# Patient Record
Sex: Male | Born: 1980 | Race: White | Hispanic: No | Marital: Single | Smoking: Current every day smoker
Health system: Southern US, Community
[De-identification: ages and names within clinical notes are randomized; demographics above are authoritative.]

## PROBLEM LIST (undated history)

## (undated) DIAGNOSIS — F419 Anxiety disorder, unspecified: Secondary | ICD-10-CM

## (undated) DIAGNOSIS — Z818 Family history of other mental and behavioral disorders: Secondary | ICD-10-CM

## (undated) DIAGNOSIS — F329 Major depressive disorder, single episode, unspecified: Secondary | ICD-10-CM

## (undated) DIAGNOSIS — J45909 Unspecified asthma, uncomplicated: Secondary | ICD-10-CM

## (undated) DIAGNOSIS — F4310 Post-traumatic stress disorder, unspecified: Secondary | ICD-10-CM

## (undated) DIAGNOSIS — F32A Depression, unspecified: Secondary | ICD-10-CM

## (undated) HISTORY — PX: TOE SURGERY: SHX1073

---
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
## 1998-10-22 ENCOUNTER — Emergency Department (HOSPITAL_COMMUNITY): Admission: EM | Admit: 1998-10-22 | Discharge: 1998-10-22 | Payer: Self-pay

## 2007-01-12 ENCOUNTER — Emergency Department (HOSPITAL_COMMUNITY): Admission: EM | Admit: 2007-01-12 | Discharge: 2007-01-13 | Payer: Self-pay | Admitting: Emergency Medicine

## 2007-07-19 ENCOUNTER — Emergency Department (HOSPITAL_COMMUNITY): Admission: EM | Admit: 2007-07-19 | Discharge: 2007-07-19 | Payer: Self-pay | Admitting: Emergency Medicine

## 2010-12-20 LAB — CBC
HCT: 42.6
Hemoglobin: 14.5
MCHC: 34
MCV: 85.4
Platelets: 265
RBC: 4.99
RDW: 12.1
WBC: 10.4

## 2010-12-20 LAB — DIFFERENTIAL
Basophils Absolute: 0
Basophils Relative: 0
Eosinophils Absolute: 0.5
Eosinophils Relative: 5
Lymphocytes Relative: 18
Lymphs Abs: 1.8
Monocytes Absolute: 0.6
Monocytes Relative: 6
Neutro Abs: 7.5
Neutrophils Relative %: 71

## 2010-12-20 LAB — BASIC METABOLIC PANEL
BUN: 9
CO2: 27
Calcium: 9.2
Chloride: 101
Creatinine, Ser: 0.93
GFR calc Af Amer: >60
GFR calc non Af Amer: >60
Glucose, Bld: 107 — ABNORMAL HIGH
Potassium: 3.8
Sodium: 135

## 2012-07-04 ENCOUNTER — Emergency Department (HOSPITAL_COMMUNITY)
Admission: EM | Admit: 2012-07-04 | Discharge: 2012-07-04 | Disposition: A | Discharge status: Home / Self Care | Payer: Self-pay | Attending: Emergency Medicine | Admitting: Emergency Medicine

## 2012-07-04 ENCOUNTER — Encounter (HOSPITAL_COMMUNITY): Payer: Self-pay | Admitting: Emergency Medicine

## 2012-07-04 DIAGNOSIS — Z87891 Personal history of nicotine dependence: Secondary | ICD-10-CM | POA: Insufficient documentation

## 2012-07-04 DIAGNOSIS — IMO0001 Reserved for inherently not codable concepts without codable children: Secondary | ICD-10-CM | POA: Insufficient documentation

## 2012-07-04 DIAGNOSIS — R5381 Other malaise: Secondary | ICD-10-CM | POA: Insufficient documentation

## 2012-07-04 DIAGNOSIS — J020 Streptococcal pharyngitis: Secondary | ICD-10-CM | POA: Insufficient documentation

## 2012-07-04 DIAGNOSIS — R509 Fever, unspecified: Secondary | ICD-10-CM | POA: Insufficient documentation

## 2012-07-04 DIAGNOSIS — R110 Nausea: Secondary | ICD-10-CM | POA: Insufficient documentation

## 2012-07-04 DIAGNOSIS — R51 Headache: Secondary | ICD-10-CM | POA: Insufficient documentation

## 2012-07-04 DIAGNOSIS — K089 Disorder of teeth and supporting structures, unspecified: Secondary | ICD-10-CM | POA: Insufficient documentation

## 2012-07-04 DIAGNOSIS — R42 Dizziness and giddiness: Secondary | ICD-10-CM | POA: Insufficient documentation

## 2012-07-04 LAB — RAPID STREP SCREEN (MED CTR MEBANE ONLY): Streptococcus, Group A Screen (Direct): POSITIVE — AB

## 2012-07-04 MED ORDER — PENICILLIN G BENZATHINE 1200000 UNIT/2ML IM SUSP
1.2000 10*6.[IU] | Freq: Once | INTRAMUSCULAR | Status: AC
  Administered 2012-07-04: 1.2 10*6.[IU] via INTRAMUSCULAR
  Filled 2012-07-04: qty 2

## 2012-07-04 MED ORDER — DEXAMETHASONE SODIUM PHOSPHATE 10 MG/ML IJ SOLN
10.0000 mg | Freq: Once | INTRAMUSCULAR | Status: AC
  Administered 2012-07-04: 10 mg via INTRAMUSCULAR
  Filled 2012-07-04: qty 1

## 2012-07-04 NOTE — ED Provider Notes (Signed)
Medical screening examination/treatment/procedure(s) were performed by non-physician practitioner and as supervising physician I was immediately available for consultation/collaboration.   Dione Booze, MD 07/04/12 [phone]

## 2012-07-04 NOTE — ED Provider Notes (Signed)
History     CSN: [id number]  Arrival date & time 07/04/12  2118   First MD Initiated Contact with Patient 07/04/12 2157      Chief Complaint  Patient presents with  . Sore Throat    (Consider location/radiation/quality/duration/timing/severity/associated sxs/prior treatment) HPI Comments: Patient presents with malaise, sore throat, fever and chills, dizziness, headache for the past 2 days. Patient taking ibuprofen without relief. He is able to swallow. No cough or shortness of breath. No difficulty walking. No neck pain or trouble moving. Onset of symptoms acute. Course is constant. Nothing makes symptoms better. Swallowing makes pain worse.  The history is provided by the patient.    History reviewed. No pertinent past medical history.  Past Surgical History  Procedure Laterality Date  . Toe surgery      No family history on file.  History  Substance Use Topics  . Smoking status: Former Games developer  . Smokeless tobacco: Not on file  . Alcohol Use: No      Review of Systems  Constitutional: Positive for fever, chills and fatigue.  HENT: Positive for sore throat and dental problem. Negative for ear pain, congestion, rhinorrhea, neck pain and neck stiffness.   Eyes: Negative for photophobia and redness.  Respiratory: Negative for cough.   Cardiovascular: Negative for chest pain.  Gastrointestinal: Positive for nausea. Negative for vomiting, abdominal pain and diarrhea.  Genitourinary: Negative for dysuria.  Musculoskeletal: Positive for myalgias.  Skin: Negative for rash.  Neurological: Positive for headaches.    Allergies  Review of patient's allergies indicates no known allergies.  Home Medications  No current outpatient prescriptions on file.  BP 128/78  Pulse 98  Temp(Src) 99.8 F (37.7 C) (Oral)  Resp 14  SpO2 97%  Physical Exam  Nursing note and vitals reviewed. Constitutional: He appears well-developed and well-nourished.  HENT:  Head: Normocephalic  and atraumatic. No trismus in the jaw.  Right Ear: Tympanic membrane, external ear and ear canal normal.  Left Ear: Tympanic membrane, external ear and ear canal normal.  Nose: Nose normal. No mucosal edema or rhinorrhea.  Mouth/Throat: Uvula is midline and mucous membranes are normal. Mucous membranes are not dry. No edematous. Posterior oropharyngeal edema and posterior oropharyngeal erythema present. No oropharyngeal exudate or tonsillar abscesses.  Eyes: Conjunctivae are normal. Right eye exhibits no discharge. Left eye exhibits no discharge.  Neck: Normal range of motion. Neck supple.  No meningismus  Cardiovascular: Normal rate, regular rhythm and normal heart sounds.   Pulmonary/Chest: Effort normal and breath sounds normal. No respiratory distress. He has no wheezes. He has no rales.  Abdominal: Soft. There is no tenderness.  Lymphadenopathy:    He has cervical adenopathy.  Neurological: He is alert.  Skin: Skin is warm and dry.  Psychiatric: He has a normal mood and affect.    ED Course  Procedures (including critical care time)  Labs Reviewed  RAPID STREP SCREEN - Abnormal; Notable for the following:    Streptococcus, Group A Screen (Direct) POSITIVE (*)    All other components within normal limits   No results found.   1. Streptococcal pharyngitis     10:48 PM Patient seen and examined. Medications ordered.   Vital signs reviewed and are as follows: Filed Vitals:   07/04/12 2129  BP: 128/78  Pulse: 98  Temp: 99.8 F (37.7 C)  Resp: 14   Patient counseled on supportive care for strep and s/s to return including worsening symptoms, persistent fever, persistent vomiting, voice change, trouble  swallowing or if they have any other concerns.  Urged to see PCP if symptoms persist for more than 3 days. Patient verbalizes understanding and agrees with plan.      MDM  Streptococcal pharyngitis. No sign of peritonsillar abscess. Do not suspect meningitis with  headache. Headache is likely due to his infection. Neuro exam is normal. Patient appears well, nontoxic. Treated with antibiotics in ED. Steroids given for symptomatic relief.      Renne Crigler, PA-C 07/04/12 2307

## 2012-07-04 NOTE — ED Notes (Signed)
PT. REPORTS SORE THROAT WITH CHILLS ,LOW GRADE FEVER , DIZZINESS AND HEADACHE FOR 2 DAYS .

## 2012-11-27 ENCOUNTER — Emergency Department (HOSPITAL_COMMUNITY)
Admission: EM | Admit: 2012-11-27 | Discharge: 2012-11-27 | Disposition: A | Discharge status: Home / Self Care | Payer: Self-pay | Attending: Emergency Medicine | Admitting: Emergency Medicine

## 2012-11-27 ENCOUNTER — Emergency Department (HOSPITAL_COMMUNITY): Payer: Self-pay

## 2012-11-27 ENCOUNTER — Encounter (HOSPITAL_COMMUNITY): Payer: Self-pay | Admitting: *Deleted

## 2012-11-27 DIAGNOSIS — Z87891 Personal history of nicotine dependence: Secondary | ICD-10-CM | POA: Insufficient documentation

## 2012-11-27 DIAGNOSIS — M79641 Pain in right hand: Secondary | ICD-10-CM

## 2012-11-27 DIAGNOSIS — S6990XA Unspecified injury of unspecified wrist, hand and finger(s), initial encounter: Secondary | ICD-10-CM | POA: Insufficient documentation

## 2012-11-27 MED ORDER — ONDANSETRON 4 MG PO TBDP
8.0000 mg | ORAL_TABLET | ORAL | Status: AC
  Administered 2012-11-27: 8 mg via ORAL
  Filled 2012-11-27: qty 2

## 2012-11-27 MED ORDER — HYDROCODONE-ACETAMINOPHEN 5-325 MG PO TABS
2.0000 | ORAL_TABLET | ORAL | Status: AC
  Administered 2012-11-27: 2 via ORAL
  Filled 2012-11-27: qty 2
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
## 2012-11-27 NOTE — ED Notes (Signed)
Patient transported to X-ray 

## 2012-11-29 NOTE — ED Provider Notes (Signed)
Medical screening examination/treatment/procedure(s) were performed by non-physician practitioner and as supervising physician I was immediately available for consultation/collaboration.   Yoshua Geisinger W. Kashif Pooler, MD 11/29/12 0820 

## 2012-12-04 ENCOUNTER — Encounter (HOSPITAL_COMMUNITY): Payer: Self-pay | Admitting: *Deleted

## 2012-12-04 ENCOUNTER — Emergency Department (HOSPITAL_COMMUNITY)
Admission: EM | Admit: 2012-12-04 | Discharge: 2012-12-04 | Disposition: A | Discharge status: Home / Self Care | Payer: Self-pay | Attending: Emergency Medicine | Admitting: Emergency Medicine

## 2012-12-04 DIAGNOSIS — Z79899 Other long term (current) drug therapy: Secondary | ICD-10-CM | POA: Insufficient documentation

## 2012-12-04 DIAGNOSIS — J029 Acute pharyngitis, unspecified: Secondary | ICD-10-CM | POA: Insufficient documentation

## 2012-12-04 DIAGNOSIS — Z87891 Personal history of nicotine dependence: Secondary | ICD-10-CM | POA: Insufficient documentation

## 2012-12-04 DIAGNOSIS — Z8659 Personal history of other mental and behavioral disorders: Secondary | ICD-10-CM | POA: Insufficient documentation

## 2012-12-04 HISTORY — DX: Family history of other mental and behavioral disorders: Z81.8

## 2012-12-04 MED ORDER — PSEUDOEPHEDRINE HCL 60 MG PO TABS: 60.0000 mg | ORAL_TABLET | ORAL | Status: DC | PRN

## 2012-12-04 NOTE — ED Notes (Signed)
Pt states already had strep throat this year, feels the same with throat hurting.

## 2012-12-04 NOTE — ED Provider Notes (Signed)
CSN: [id number]     Arrival date & time 12/04/12  2004 History  This chart was scribed for non-physician practitioner working with Shelda Jakes, MD by Greggory Stallion, ED scribe. This patient was seen in room TR11C/TR11C and the patient's care was started at 10:45 PM.   Chief Complaint  Patient presents with  . Sore Throat   The history is provided by the patient. No language interpreter was used.    HPI Comments: Stephen Ramsey is a 32 y.o. male who presents to the Emergency Department complaining of gradual onset, gradually worsening burning sore throat that started around noon today. He has also had a headache and burning nasal congestion. Pt states productive cough worsens the pain. Pt has used Hall's cough drops with no relief. He denies ear pain, fever, abdominal pain, nausea and emesis. Pt states he smokes cigarettes daily.   Past Medical History  Diagnosis Date  . FH: mental illness    Past Surgical History  Procedure Laterality Date  . Toe surgery     History reviewed. No pertinent family history. History  Substance Use Topics  . Smoking status: Former Games developer  . Smokeless tobacco: Not on file  . Alcohol Use: No    Review of Systems  Constitutional: Negative for fever.  HENT: Positive for congestion and sore throat. Negative for ear pain.   Respiratory: Positive for cough.   Gastrointestinal: Negative for nausea, vomiting and abdominal pain.  Neurological: Positive for headaches.  All other systems reviewed and are negative.    Allergies  Review of patient's allergies indicates no known allergies.  Home Medications   Current Outpatient Rx  Name  Route  Sig  Dispense  Refill  . ARIPiprazole (ABILIFY) 10 MG tablet   Oral   Take 10 mg by mouth daily.         Marland Kitchen FLUoxetine (PROZAC) 40 MG capsule   Oral   Take 40 mg by mouth daily.          BP 123/66  Pulse 93  Temp(Src) 98.5 F (36.9 C) (Oral)  Resp 16  Ht 5\' 7"  (1.702 m)  Wt 210 lb (95.255  kg)  BMI 32.88 kg/m2  SpO2 100%  Physical Exam  Nursing note and vitals reviewed. Constitutional: He is oriented to person, place, and time. He appears well-developed and well-nourished. No distress.  HENT:  Head: Normocephalic and atraumatic.  Right Ear: Tympanic membrane and external ear normal.  Left Ear: Tympanic membrane and external ear normal.  Nose: Nose normal.  No tonsillar swelling. No exudate. Uvula midline. No trismus.   Eyes: Conjunctivae are normal.  Neck: Normal range of motion. No tracheal deviation present.  Cardiovascular: Normal rate, regular rhythm and normal heart sounds.   Pulmonary/Chest: Effort normal and breath sounds normal. No stridor.  Abdominal: Soft. He exhibits no distension. There is no tenderness.  Musculoskeletal: Normal range of motion.  Lymphadenopathy:    He has cervical adenopathy.  Neurological: He is alert and oriented to person, place, and time.  Skin: Skin is warm and dry. He is not diaphoretic.  Psychiatric: He has a normal mood and affect. His behavior is normal.    ED Course  Procedures (including critical care time)  DIAGNOSTIC STUDIES: Oxygen Saturation is 100% on RA, normal by my interpretation.    COORDINATION OF CARE: 10:49 PM-Discussed treatment plan which includes nasal saline spray, salt water gargles, chloraseptic spray and decongestant with pt at bedside and pt agreed to plan.  Labs Review Labs Reviewed  RAPID STREP SCREEN  CULTURE, GROUP A STREP   Smoking cessation instruction/counseling given:  counseled patient on the dangers of tobacco use, advised patient to stop smoking, and reviewed strategies to maximize success. Patient is very reluctant to quit.    Imaging Review No results found.  MDM   1. Viral pharyngitis    Pt afebrile without tonsillar exudate, negative strep. Presents with mild cervical lymphadenopathy, & dysphagia; diagnosis of viral pharyngitis. No abx indicated. DC w symptomatic tx. Pt does  not appear dehydrated, but did discuss importance of water rehydration. Presentation non concerning for PTA or infxn spread to soft tissue. No trismus or uvula deviation. Specific return precautions discussed. Pt able to drink water in ED without difficulty with intact air way. Recommended PCP follow up.    I personally performed the services described in this documentation, which was scribed in my presence. The recorded information has been reviewed and is accurate.    Mora Bellman, PA-C 12/05/12 [phone]

## 2012-12-05 NOTE — ED Provider Notes (Signed)
Medical screening examination/treatment/procedure(s) were performed by non-physician practitioner and as supervising physician I was immediately available for consultation/collaboration.   Demetrios Byron W. Jaskirat Zertuche, MD 12/05/12 1213 

## 2012-12-06 LAB — CULTURE, GROUP A STREP

## 2014-01-17 ENCOUNTER — Emergency Department (HOSPITAL_COMMUNITY): Payer: Self-pay

## 2014-01-17 ENCOUNTER — Emergency Department (HOSPITAL_COMMUNITY)
Admission: EM | Admit: 2014-01-17 | Discharge: 2014-01-17 | Disposition: A | Discharge status: Home / Self Care | Payer: Self-pay | Attending: Emergency Medicine | Admitting: Emergency Medicine

## 2014-01-17 ENCOUNTER — Encounter (HOSPITAL_COMMUNITY): Payer: Self-pay | Admitting: Emergency Medicine

## 2014-01-17 DIAGNOSIS — R079 Chest pain, unspecified: Secondary | ICD-10-CM | POA: Insufficient documentation

## 2014-01-17 DIAGNOSIS — Z720 Tobacco use: Secondary | ICD-10-CM | POA: Insufficient documentation

## 2014-01-17 DIAGNOSIS — R05 Cough: Secondary | ICD-10-CM

## 2014-01-17 DIAGNOSIS — Z79899 Other long term (current) drug therapy: Secondary | ICD-10-CM | POA: Insufficient documentation

## 2014-01-17 DIAGNOSIS — R059 Cough, unspecified: Secondary | ICD-10-CM

## 2014-01-17 DIAGNOSIS — J069 Acute upper respiratory infection, unspecified: Secondary | ICD-10-CM

## 2014-01-17 DIAGNOSIS — Z8659 Personal history of other mental and behavioral disorders: Secondary | ICD-10-CM | POA: Insufficient documentation

## 2014-01-17 DIAGNOSIS — R062 Wheezing: Secondary | ICD-10-CM

## 2014-01-17 MED ORDER — ALBUTEROL SULFATE HFA 108 (90 BASE) MCG/ACT IN AERS
2.0000 | INHALATION_SPRAY | Freq: Once | RESPIRATORY_TRACT | Status: AC
  Administered 2014-01-17: 2 via RESPIRATORY_TRACT
  Filled 2014-01-17: qty 6.7

## 2014-01-17 MED ORDER — ALBUTEROL SULFATE (2.5 MG/3ML) 0.083% IN NEBU
5.0000 mg | INHALATION_SOLUTION | Freq: Once | RESPIRATORY_TRACT | Status: AC
  Administered 2014-01-17: 5 mg via RESPIRATORY_TRACT
  Filled 2014-01-17: qty 6

## 2014-01-17 MED ORDER — PREDNISONE 20 MG PO TABS: 60.0000 mg | ORAL_TABLET | Freq: Every day | ORAL | Status: DC

## 2014-01-17 MED ORDER — IPRATROPIUM BROMIDE 0.02 % IN SOLN
0.5000 mg | Freq: Once | RESPIRATORY_TRACT | Status: AC
  Administered 2014-01-17: 0.5 mg via RESPIRATORY_TRACT
  Filled 2014-01-17: qty 2.5

## 2014-01-17 MED ORDER — AZITHROMYCIN 250 MG PO TABS: 250.0000 mg | ORAL_TABLET | Freq: Every day | ORAL | Status: DC

## 2014-01-17 MED ORDER — PREDNISONE 20 MG PO TABS
60.0000 mg | ORAL_TABLET | Freq: Once | ORAL | Status: AC
  Administered 2014-01-17: 60 mg via ORAL
  Filled 2014-01-17: qty 3

## 2014-01-17 NOTE — ED Notes (Signed)
PT reports feeling better; resting; no signs of distress or chest pain reported.

## 2014-01-17 NOTE — ED Notes (Signed)
WHEEZING FOR few days; pack day smoker. Takes OTC allergy meds. No hx of asthma. Coughing; denies fevers. Chest pain when coughing.

## 2014-01-17 NOTE — Discharge Instructions (Signed)
How to Use an Inhaler Proper inhaler technique is very important. Good technique ensures that the medicine reaches the lungs. Poor technique results in depositing the medicine on the tongue and back of the throat rather than in the airways. If you do not use the inhaler with good technique, the medicine will not help you. STEPS TO FOLLOW IF USING AN INHALER WITHOUT AN EXTENSION TUBE  Remove the cap from the inhaler.  If you are using the inhaler for the first time, you will need to prime it. Shake the inhaler for 5 seconds and release four puffs into the air, away from your face. Ask your health care provider or pharmacist if you have questions about priming your inhaler.  Shake the inhaler for 5 seconds before each breath in (inhalation).  Position the inhaler so that the top of the canister faces up.  Put your index finger on the top of the medicine canister. Your thumb supports the bottom of the inhaler.  Open your mouth.  Either place the inhaler between your teeth and place your lips tightly around the mouthpiece, or hold the inhaler 1-2 inches away from your open mouth. If you are unsure of which technique to use, ask your health care provider.  Breathe out (exhale) normally and as completely as possible.  Press the canister down with your index finger to release the medicine.  At the same time as the canister is pressed, inhale deeply and slowly until your lungs are completely filled. This should take 4-6 seconds. Keep your tongue down.  Hold the medicine in your lungs for 5-10 seconds (10 seconds is best). This helps the medicine get into the small airways of your lungs.  Breathe out slowly, through pursed lips. Whistling is an example of pursed lips.  Wait at least 15-30 seconds between puffs. Continue with the above steps until you have taken the number of puffs your health care provider has ordered. Do not use the inhaler more than your health care provider tells  you.  Replace the cap on the inhaler.  Follow the directions from your health care provider or the inhaler insert for cleaning the inhaler. STEPS TO FOLLOW IF USING AN INHALER WITH AN EXTENSION (SPACER)  Remove the cap from the inhaler.  If you are using the inhaler for the first time, you will need to prime it. Shake the inhaler for 5 seconds and release four puffs into the air, away from your face. Ask your health care provider or pharmacist if you have questions about priming your inhaler.  Shake the inhaler for 5 seconds before each breath in (inhalation).  Place the open end of the spacer onto the mouthpiece of the inhaler.  Position the inhaler so that the top of the canister faces up and the spacer mouthpiece faces you.  Put your index finger on the top of the medicine canister. Your thumb supports the bottom of the inhaler and the spacer.  Breathe out (exhale) normally and as completely as possible.  Immediately after exhaling, place the spacer between your teeth and into your mouth. Close your lips tightly around the spacer.  Press the canister down with your index finger to release the medicine.  At the same time as the canister is pressed, inhale deeply and slowly until your lungs are completely filled. This should take 4-6 seconds. Keep your tongue down and out of the way.  Hold the medicine in your lungs for 5-10 seconds (10 seconds is best). This helps the  medicine get into the small airways of your lungs. Exhale.  Repeat inhaling deeply through the spacer mouthpiece. Again hold that breath for up to 10 seconds (10 seconds is best). Exhale slowly. If it is difficult to take this second deep breath through the spacer, breathe normally several times through the spacer. Remove the spacer from your mouth.  Wait at least 15-30 seconds between puffs. Continue with the above steps until you have taken the number of puffs your health care provider has ordered. Do not use the  inhaler more than your health care provider tells you.  Remove the spacer from the inhaler, and place the cap on the inhaler.  Follow the directions from your health care provider or the inhaler insert for cleaning the inhaler and spacer. If you are using different kinds of inhalers, use your quick relief medicine to open the airways 10-15 minutes before using a steroid if instructed to do so by your health care provider. If you are unsure which inhalers to use and the order of using them, ask your health care provider, nurse, or respiratory therapist. If you are using a steroid inhaler, always rinse your mouth with water after your last puff, then gargle and spit out the water. Do not swallow the water. AVOID:  Inhaling before or after starting the spray of medicine. It takes practice to coordinate your breathing with triggering the spray.  Inhaling through the nose (rather than the mouth) when triggering the spray. HOW TO DETERMINE IF YOUR INHALER IS FULL OR NEARLY EMPTY You cannot know when an inhaler is empty by shaking it. A few inhalers are now being made with dose counters. Ask your health care provider for a prescription that has a dose counter if you feel you need that extra help. If your inhaler does not have a counter, ask your health care provider to help you determine the date you need to refill your inhaler. Write the refill date on a calendar or your inhaler canister. Refill your inhaler 7-10 days before it runs out. Be sure to keep an adequate supply of medicine. This includes making sure it is not expired, and that you have a spare inhaler.  SEEK MEDICAL CARE IF:   Your symptoms are only partially relieved with your inhaler.  You are having trouble using your inhaler.  You have some increase in phlegm. SEEK IMMEDIATE MEDICAL CARE IF:   You feel little or no relief with your inhalers. You are still wheezing and are feeling shortness of breath or tightness in your chest or  both.  You have dizziness, headaches, or a fast heart rate.  You have chills, fever, or night sweats.  You have a noticeable increase in phlegm production, or there is blood in the phlegm. MAKE SURE YOU:   Understand these instructions.  Will watch your condition.  Will get help right away if you are not doing well or get worse. Document Released: 02/25/2000 Document Revised: 12/18/2012 Document Reviewed: 09/26/2012 [facility] Patient Information 2015 [place], [place]. This information is not intended to replace advice given to you by your health care provider. Make sure you discuss any questions you have with your health care provider.  Upper Respiratory Infection, Adult An upper respiratory infection (URI) is also sometimes known as the common cold. The upper respiratory tract includes the nose, sinuses, throat, trachea, and bronchi. Bronchi are the airways leading to the lungs. Most people improve within 1 week, but symptoms can last up to 2 weeks. A residual  cough may last even longer.  CAUSES Many different viruses can infect the tissues lining the upper respiratory tract. The tissues become irritated and inflamed and often become very moist. Mucus production is also common. A cold is contagious. You can easily spread the virus to others by oral contact. This includes kissing, sharing a glass, coughing, or sneezing. Touching your mouth or nose and then touching a surface, which is then touched by another person, can also spread the virus. SYMPTOMS  Symptoms typically develop 1 to 3 days after you come in contact with a cold virus. Symptoms vary from person to person. They may include:  Runny nose.  Sneezing.  Nasal congestion.  Sinus irritation.  Sore throat.  Loss of voice (laryngitis).  Cough.  Fatigue.  Muscle aches.  Loss of appetite.  Headache.  Low-grade fever. DIAGNOSIS  You might diagnose your own cold based on familiar symptoms, since most people get a cold  2 to 3 times a year. Your caregiver can confirm this based on your exam. Most importantly, your caregiver can check that your symptoms are not due to another disease such as strep throat, sinusitis, pneumonia, asthma, or epiglottitis. Blood tests, throat tests, and X-rays are not necessary to diagnose a common cold, but they may sometimes be helpful in excluding other more serious diseases. Your caregiver will decide if any further tests are required. RISKS AND COMPLICATIONS  You may be at risk for a more severe case of the common cold if you smoke cigarettes, have chronic heart disease (such as heart failure) or lung disease (such as asthma), or if you have a weakened immune system. The very young and very old are also at risk for more serious infections. Bacterial sinusitis, middle ear infections, and bacterial pneumonia can complicate the common cold. The common cold can worsen asthma and chronic obstructive pulmonary disease (COPD). Sometimes, these complications can require emergency medical care and may be life-threatening. PREVENTION  The best way to protect against getting a cold is to practice good hygiene. Avoid oral or hand contact with people with cold symptoms. Wash your hands often if contact occurs. There is no clear evidence that vitamin C, vitamin E, echinacea, or exercise reduces the chance of developing a cold. However, it is always recommended to get plenty of rest and practice good nutrition. TREATMENT  Treatment is directed at relieving symptoms. There is no cure. Antibiotics are not effective, because the infection is caused by a virus, not by bacteria. Treatment may include:  Increased fluid intake. Sports drinks offer valuable electrolytes, sugars, and fluids.  Breathing heated mist or steam (vaporizer or shower).  Eating chicken soup or other clear broths, and maintaining good nutrition.  Getting plenty of rest.  Using gargles or lozenges for comfort.  Controlling fevers  with ibuprofen or acetaminophen as directed by your caregiver.  Increasing usage of your inhaler if you have asthma. Zinc gel and zinc lozenges, taken in the first 24 hours of the common cold, can shorten the duration and lessen the severity of symptoms. Pain medicines may help with fever, muscle aches, and throat pain. A variety of non-prescription medicines are available to treat congestion and runny nose. Your caregiver can make recommendations and may suggest nasal or lung inhalers for other symptoms.  HOME CARE INSTRUCTIONS   Only take over-the-counter or prescription medicines for pain, discomfort, or fever as directed by your caregiver.  Use a warm mist humidifier or inhale steam from a shower to increase air moisture. This  may keep secretions moist and make it easier to breathe.  Drink enough water and fluids to keep your urine clear or pale yellow.  Rest as needed.  Return to work when your temperature has returned to normal or as your caregiver advises. You may need to stay home longer to avoid infecting others. You can also use a face mask and careful hand washing to prevent spread of the virus. SEEK MEDICAL CARE IF:   After the first few days, you feel you are getting worse rather than better.  You need your caregiver's advice about medicines to control symptoms.  You develop chills, worsening shortness of breath, or brown or red sputum. These may be signs of pneumonia.  You develop yellow or brown nasal discharge or pain in the face, especially when you bend forward. These may be signs of sinusitis.  You develop a fever, swollen neck glands, pain with swallowing, or white areas in the back of your throat. These may be signs of strep throat. SEEK IMMEDIATE MEDICAL CARE IF:   You have a fever.  You develop severe or persistent headache, ear pain, sinus pain, or chest pain.  You develop wheezing, a prolonged cough, cough up blood, or have a change in your usual mucus (if you  have chronic lung disease).  You develop sore muscles or a stiff neck. Document Released: 08/23/2000 Document Revised: 05/22/2011 Document Reviewed: 06/04/2013 [facility] Patient Information 2015 [place], [place]. This information is not intended to replace advice given to you by your health care provider. Make sure you discuss any questions you have with your health care provider.   Nicotine Addiction Nicotine can act as both a stimulant (excites/activates) and a sedative (calms/quiets). Immediately after exposure to nicotine, there is a "kick" caused in part by the drug's stimulation of the adrenal glands and resulting discharge of adrenaline (epinephrine). The rush of adrenaline stimulates the body and causes a sudden release of sugar. This means that smokers are always slightly hyperglycemic. Hyperglycemic means that the blood sugar is high, just like in diabetics. Nicotine also decreases the amount of insulin which helps control sugar levels in the body. There is an increase in blood pressure, breathing, and the rate of heart beats.  In addition, nicotine indirectly causes a release of dopamine in the brain that controls pleasure and motivation. A similar reaction is seen with other drugs of abuse, such as cocaine and heroin. This dopamine release is thought to cause the pleasurable sensations when smoking. In some different cases, nicotine can also create a calming effect, depending on sensitivity of the smoker's nervous system and the dose of nicotine taken. WHAT HAPPENS WHEN NICOTINE IS TAKEN FOR LONG PERIODS OF TIME?  Long-term use of nicotine results in addiction. It is difficult to stop.  Repeated use of nicotine creates tolerance. Higher doses of nicotine are needed to get the "kick." When nicotine use is stopped, withdrawal may last a month or more. Withdrawal may begin within a few hours after the last cigarette. Symptoms peak within the first few days and may lessen within a few weeks. For  some people, however, symptoms may last for months or longer. Withdrawal symptoms include:   Irritability.  Craving.  Learning and attention deficits.  Sleep disturbances.  Increased appetite. Craving for tobacco may last for 6 months or longer. Many behaviors done while using nicotine can also play a part in the severity of withdrawal symptoms. For some people, the feel, smell, and sight of a cigarette and the  ritual of obtaining, handling, lighting, and smoking the cigarette are closely linked with the pleasure of smoking. When stopped, they also miss the related behaviors which make the withdrawal or craving worse. While nicotine gum and patches may lessen the drug aspects of withdrawal, cravings often persist. WHAT ARE THE MEDICAL CONSEQUENCES OF NICOTINE USE?  Nicotine addiction accounts for one-third of all cancers. The top cancer caused by tobacco is lung cancer. Lung cancer is the number one cancer killer of both men and women.  Smoking is also associated with cancers of the:  Mouth.  Pharynx.  Larynx.  Esophagus.  Stomach.  Pancreas.  Cervix.  Kidney.  Ureter.  Bladder.  Smoking also causes lung diseases such as lasting (chronic) bronchitis and emphysema.  It worsens asthma in adults and children.  Smoking increases the risk of heart disease, including:  Stroke.  Heart attack.  Vascular disease.  Aneurysm.  Passive or secondary smoke can also increase medical risks including:  Asthma in children.  Sudden Infant Death Syndrome (SIDS).  Additionally, dropped cigarettes are the leading cause of residential fire fatalities.  Nicotine poisoning has been reported from accidental ingestion of tobacco products by children and pets. Death usually results in a few minutes from respiratory failure (when a person stops breathing) caused by paralysis. TREATMENT   Medication. Nicotine replacement medicines such as nicotine gum and the patch are used to stop  smoking. These medicines gradually lower the dosage of nicotine in the body. These medicines do not contain the carbon monoxide and other toxins found in tobacco smoke.  Hypnotherapy.  Relaxation therapy.  Nicotine Anonymous (a 12-step support program). Find times and locations in your local yellow pages. Document Released: 11/03/2003 Document Revised: 05/22/2011 Document Reviewed: 04/25/2013 [facility] Patient Information 2015 [place], [place]. This information is not intended to replace advice given to you by your health care provider. Make sure you discuss any questions you have with your health care provider.

## 2014-01-17 NOTE — ED Notes (Signed)
Pt in xray

## 2014-01-17 NOTE — ED Provider Notes (Signed)
TIME SEEN: 9:49 AM  CHIEF COMPLAINT: wheezing, cough, chest pain with coughing  HPI: Pt is a 33 y.o. M with history of tobacco use who presents to the emergency department with complaints of wheezing, productive cough for the past week. Denies any fevers or chills. He does have chest tightness and pain with coughing. No history of coronary artery disease, PE or DVT. No lower extremity swelling or pain. Denies any sick contacts or recent travel. Denies a history of asthma or COPD. Does not wear oxygen at home.  ROS: See HPI Constitutional: no fever  Eyes: no drainage  ENT: no runny nose   Cardiovascular:   chest pain  Resp:  SOB  GI: no vomiting GU: no dysuria Integumentary: no rash  Allergy: no hives  Musculoskeletal: no leg swelling  Neurological: no slurred speech ROS otherwise negative  PAST MEDICAL HISTORY/PAST SURGICAL HISTORY:  Past Medical History  Diagnosis Date  . FH: mental illness     MEDICATIONS:  Prior to Admission medications   Medication Sig Start Date End Date Taking? Authorizing Provider  ARIPiprazole (ABILIFY) 10 MG tablet Take 10 mg by mouth daily.    Historical Provider, MD  FLUoxetine (PROZAC) 40 MG capsule Take 40 mg by mouth daily.    Historical Provider, MD  ibuprofen (ADVIL,MOTRIN) 200 MG tablet Take 800 mg by mouth every 6 (six) hours as needed for pain.    Historical Provider, MD  pseudoephedrine (SUDAFED) 60 MG tablet Take 1 tablet (60 mg total) by mouth every 4 (four) hours as needed for congestion. 12/04/12   Mora BellmanHannah S Merrell, PA-C    ALLERGIES:  No Known Allergies  SOCIAL HISTORY:  History  Substance Use Topics  . Smoking status: Current Every Day Smoker -- 1.00 packs/day    Types: Cigarettes  . Smokeless tobacco: Not on file  . Alcohol Use: No    FAMILY HISTORY: History reviewed. No pertinent family history.  EXAM: BP 118/72 mmHg  Pulse 86  Temp(Src) 99 F (37.2 C) (Oral)  Resp 20  Ht 5\' 7"  (1.702 m)  Wt 230 lb (104.327 kg)  BMI  36.01 kg/m2  SpO2 96% CONSTITUTIONAL: Alert and oriented and responds appropriately to questions. Well-appearing; well-nourished HEAD: Normocephalic EYES: Conjunctivae clear, PERRL ENT: normal nose; no rhinorrhea; moist mucous membranes; pharynx without lesions noted NECK: Supple, no meningismus, no LAD  CARD: RRR; S1 and S2 appreciated; no murmurs, no clicks, no rubs, no gallops RESP: Normal chest excursion without splinting or tachypnea; patient has equal breath sounds bilaterally but diffuse expiratory wheezing, no rhonchi or rales, diminished at his bases bilaterally, speaking full senses, no hypoxia or respiratory distress ABD/GI: Normal bowel sounds; non-distended; soft, non-tender, no rebound, no guarding BACK:  The back appears normal and is non-tender to palpation, there is no CVA tenderness EXT: Normal ROM in all joints; non-tender to palpation; no edema; normal capillary refill; no cyanosis    SKIN: Normal color for age and race; warm NEURO: Moves all extremities equally PSYCH: The patient's mood and manner are appropriate. Grooming and personal hygiene are appropriate.  MEDICAL DECISION MAKING: Pt here with wheezing, cough. Suspect bronchitis, URI, possible pneumonia. We'll obtain chest x-ray, give DuoNeb treatment. We'll also obtain EKG. He has no risk factors for pulmonary embolus other than tobacco use.  He is PERC negative.  Anticipate discharge home as patient is very well-appearing with no respiratory distress or hypoxia. We'll discharge with azithromycin given he is a smoker and his prolonged course, albuterol nebulizer, prednisone.  ED PROGRESS: Pt reports feeling much better. His lungs are now clear to auscultation. No hypoxia. Chest pain now gone. EKG shows no ischemic changes. We'll discharge home with prednisone, albuterol, azithromycin. Have advised him to quit smoking. Have discussed return precautions. He verbalizes understanding and is comfortable with plan.     EKG  Interpretation  Date/Time:  Saturday January 17 2014 10:04:07 EST Ventricular Rate:  101 PR Interval:  146 QRS Duration: 78 QT Interval:  322 QTC Calculation: 417 R Axis:   57 Text Interpretation:  Sinus tachycardia Low voltage, precordial leads No significant change since last tracing Confirmed by Ryenne Lynam,  DO, Raoul Ciano [phone]) on 01/17/2014 10:10:06 AM        Layla MawKristen N Sanam Marmo, DO 01/17/14 1136

## 2014-01-29 ENCOUNTER — Emergency Department (HOSPITAL_COMMUNITY): Payer: Self-pay

## 2014-01-29 ENCOUNTER — Encounter (HOSPITAL_COMMUNITY): Payer: Self-pay | Admitting: Emergency Medicine

## 2014-01-29 ENCOUNTER — Emergency Department (HOSPITAL_COMMUNITY)
Admission: EM | Admit: 2014-01-29 | Discharge: 2014-01-29 | Disposition: A | Discharge status: Home / Self Care | Payer: Self-pay | Attending: Emergency Medicine | Admitting: Emergency Medicine

## 2014-01-29 DIAGNOSIS — Z720 Tobacco use: Secondary | ICD-10-CM | POA: Insufficient documentation

## 2014-01-29 DIAGNOSIS — R0602 Shortness of breath: Secondary | ICD-10-CM

## 2014-01-29 DIAGNOSIS — R05 Cough: Secondary | ICD-10-CM | POA: Insufficient documentation

## 2014-01-29 DIAGNOSIS — Z79899 Other long term (current) drug therapy: Secondary | ICD-10-CM | POA: Insufficient documentation

## 2014-01-29 DIAGNOSIS — R059 Cough, unspecified: Secondary | ICD-10-CM

## 2014-01-29 LAB — CBC WITH DIFFERENTIAL/PLATELET
Basophils Absolute: 0 10*3/uL (ref 0.0–0.1)
Basophils Relative: 0 % (ref 0–1)
Eosinophils Absolute: 1.1 10*3/uL — ABNORMAL HIGH (ref 0.0–0.7)
Eosinophils Relative: 7 % — ABNORMAL HIGH (ref 0–5)
HEMATOCRIT: 46.8 % (ref 39.0–52.0)
HEMOGLOBIN: 15.6 g/dL (ref 13.0–17.0)
LYMPHS ABS: 2.1 10*3/uL (ref 0.7–4.0)
LYMPHS PCT: 14 % (ref 12–46)
MCH: 29.7 pg (ref 26.0–34.0)
MCHC: 33.3 g/dL (ref 30.0–36.0)
MCV: 89 fL (ref 78.0–100.0)
MONO ABS: 1.1 10*3/uL — AB (ref 0.1–1.0)
MONOS PCT: 8 % (ref 3–12)
NEUTROS ABS: 10.5 10*3/uL — AB (ref 1.7–7.7)
Neutrophils Relative %: 71 % (ref 43–77)
Platelets: 210 10*3/uL (ref 150–400)
RBC: 5.26 MIL/uL (ref 4.22–5.81)
RDW: 13 % (ref 11.5–15.5)
WBC: 14.8 10*3/uL — AB (ref 4.0–10.5)

## 2014-01-29 LAB — I-STAT CG4 LACTIC ACID, ED: LACTIC ACID, VENOUS: 1.17 mmol/L (ref 0.5–2.2)

## 2014-01-29 LAB — BASIC METABOLIC PANEL
Anion gap: 13 (ref 5–15)
BUN: 10 mg/dL (ref 6–23)
CHLORIDE: 100 meq/L (ref 96–112)
CO2: 28 meq/L (ref 19–32)
CREATININE: 0.79 mg/dL (ref 0.50–1.35)
Calcium: 9.2 mg/dL (ref 8.4–10.5)
GFR calc Af Amer: >90 mL/min (ref >90)
GFR calc non Af Amer: >90 mL/min (ref >90)
Glucose, Bld: 120 mg/dL — ABNORMAL HIGH (ref 70–99)
POTASSIUM: 4.2 meq/L (ref 3.7–5.3)
Sodium: 141 mEq/L (ref 137–147)

## 2014-01-29 LAB — I-STAT TROPONIN, ED: Troponin i, poc: 0 ng/mL (ref 0.00–0.08)

## 2014-01-29 MED ORDER — PREDNISONE 20 MG PO TABS
60.0000 mg | ORAL_TABLET | Freq: Once | ORAL | Status: AC
  Administered 2014-01-29: 60 mg via ORAL
  Filled 2014-01-29: qty 3

## 2014-01-29 MED ORDER — ALBUTEROL SULFATE HFA 108 (90 BASE) MCG/ACT IN AERS
1.0000 | INHALATION_SPRAY | RESPIRATORY_TRACT | Status: DC | PRN
  Filled 2014-01-29: qty 6.7

## 2014-01-29 MED ORDER — PREDNISONE 20 MG PO TABS: 40.0000 mg | ORAL_TABLET | Freq: Every day | ORAL | Status: DC

## 2014-01-29 MED ORDER — ALBUTEROL (5 MG/ML) CONTINUOUS INHALATION SOLN
10.0000 mg/h | INHALATION_SOLUTION | RESPIRATORY_TRACT | Status: AC
  Administered 2014-01-29: 10 mg/h via RESPIRATORY_TRACT
  Filled 2014-01-29: qty 20

## 2014-01-29 MED ORDER — LEVOFLOXACIN 750 MG PO TABS: 750.0000 mg | ORAL_TABLET | Freq: Every day | ORAL | Status: DC

## 2014-01-29 MED ORDER — SODIUM CHLORIDE 0.9 % IV BOLUS (SEPSIS)
1000.0000 mL | Freq: Once | INTRAVENOUS | Status: AC
  Administered 2014-01-29: 1000 mL via INTRAVENOUS

## 2014-01-29 MED ORDER — IPRATROPIUM-ALBUTEROL 0.5-2.5 (3) MG/3ML IN SOLN
3.0000 mL | Freq: Once | RESPIRATORY_TRACT | Status: AC
  Administered 2014-01-29: 3 mL via RESPIRATORY_TRACT
  Filled 2014-01-29: qty 3

## 2014-01-29 NOTE — Discharge Instructions (Signed)
Please stop smoking. Take the prescribed medication as directed-- use coupon for levaquin. Follow-up with [facility]. Return to the ED for new or worsening symptoms

## 2014-01-29 NOTE — ED Notes (Signed)
Pt stated he was diagnosed with a URI 1 week ago but did not have money for medication.  Pt states he has had a lot of coughing/ productive with green mucous x 2 days.  Pt states he feels Sob/ SAT 92 RA.

## 2014-01-29 NOTE — ED Notes (Addendum)
Pt seen here recently for URI; did not get antibiotics filled; heavy smoker; still SOB and coughing. Chest pain with cough.

## 2014-01-29 NOTE — ED Notes (Signed)
Placed pt on 2 liters of (nasal cannula) due to pt complaining of SOB

## 2014-01-29 NOTE — ED Provider Notes (Signed)
CSN: [id number]     Arrival date & time 01/29/14  1309 History   First MD Initiated Contact with Patient 01/29/14 1710     Chief Complaint  Patient presents with  . Cough  . Shortness of Breath     (Consider location/radiation/quality/duration/timing/severity/associated sxs/prior Treatment) The history is provided by the patient and medical records.    This is a 33 y.o. M with no significant past medical history presenting to the ED for cough and shortness of breath. Patient was seen in the ED on 01/17/2014, diagnosed with a URI and started on azithromycin and prednisone. He states he did fill the prednisone and was using the albuterol inhaler he was given, but did not feel antibiotics. States he has continued to have a productive cough with thick, green mucus. States he feels very short of breath and can feel himself wheezing.  He denies any chest pain, but states it does feel somewhat tight. No reported fever or chills.  Patient is a daily heavy smoker. He has not on home oxygen. Has been using albuterol inhaler almost continuously throughout the day today.  On arrival, patient is tachypneic and tachycardic with labored respirations, wheezing audible from doorway.    Past Medical History  Diagnosis Date  . FH: mental illness    Past Surgical History  Procedure Laterality Date  . Toe surgery     History reviewed. No pertinent family history. History  Substance Use Topics  . Smoking status: Current Every Day Smoker -- 1.00 packs/day    Types: Cigarettes  . Smokeless tobacco: Not on file  . Alcohol Use: No    Review of Systems  Respiratory: Positive for cough, shortness of breath and wheezing.   All other systems reviewed and are negative.     Allergies  Review of patient's allergies indicates no known allergies.  Home Medications   Prior to Admission medications   Medication Sig Start Date End Date Taking? Authorizing Provider  ARIPiprazole (ABILIFY) 10 MG tablet Take  10 mg by mouth daily.    Historical Provider, MD  azithromycin (ZITHROMAX) 250 MG tablet Take 1 tablet (250 mg total) by mouth daily. Take first 2 tablets together, then 1 every day until finished. 01/17/14   Kristen N Ward, DO  FLUoxetine (PROZAC) 40 MG capsule Take 40 mg by mouth daily.    Historical Provider, MD  ibuprofen (ADVIL,MOTRIN) 200 MG tablet Take 800 mg by mouth every 6 (six) hours as needed for pain.    Historical Provider, MD  predniSONE (DELTASONE) 20 MG tablet Take 3 tablets (60 mg total) by mouth daily. 01/17/14   Kristen N Ward, DO  pseudoephedrine (SUDAFED) 60 MG tablet Take 1 tablet (60 mg total) by mouth every 4 (four) hours as needed for congestion. 12/04/12   Mora BellmanHannah S Merrell, PA-C   BP 117/84 mmHg  Pulse 107  Temp(Src) 99.1 F (37.3 C) (Oral)  Resp 20  Ht 5\' 7"  (1.702 m)  Wt 225 lb (102.059 kg)  BMI 35.23 kg/m2  SpO2 97%   Physical Exam  Constitutional: He is oriented to person, place, and time. He appears well-developed and well-nourished.  HENT:  Head: Normocephalic and atraumatic.  Mouth/Throat: Oropharynx is clear and moist.  Eyes: Conjunctivae and EOM are normal. Pupils are equal, round, and reactive to light.  Neck: Normal range of motion.  Cardiovascular: Normal rate, regular rhythm and normal heart sounds.   Pulmonary/Chest: Accessory muscle usage present. No respiratory distress. He has wheezes. He has no rales.  Increased work of breathing, inspiratory and expiratory wheezes noted throughout, patient speaking in short truncated sentences  Abdominal: Soft. Bowel sounds are normal.  Musculoskeletal: Normal range of motion.  Neurological: He is alert and oriented to person, place, and time.  Skin: Skin is warm and dry.  Psychiatric: He has a normal mood and affect.  Nursing note and vitals reviewed.   ED Course  Procedures (including critical care time)  CRITICAL CARE Performed by: Garlon HatchetSANDERS, Nimco Bivens M   Total critical care time:35  Critical care  time was exclusive of separately billable procedures and treating other patients.  Critical care was necessary to treat or prevent imminent or life-threatening deterioration.  Critical care was time spent personally by me on the following activities: development of treatment plan with patient and/or surrogate as well as nursing, discussions with consultants, evaluation of patient's response to treatment, examination of patient, obtaining history from patient or surrogate, ordering and performing treatments and interventions, ordering and review of laboratory studies, ordering and review of radiographic studies, pulse oximetry and re-evaluation of patient's condition.  Medications  albuterol (PROVENTIL,VENTOLIN) solution continuous neb (10 mg/hr Nebulization New Bag/Given 01/29/14 1850)  albuterol (PROVENTIL HFA;VENTOLIN HFA) 108 (90 BASE) MCG/ACT inhaler 1-2 puff (not administered)  sodium chloride 0.9 % bolus 1,000 mL (0 mLs Intravenous Stopped 01/29/14 1833)  ipratropium-albuterol (DUONEB) 0.5-2.5 (3) MG/3ML nebulizer solution 3 mL (3 mLs Nebulization Given 01/29/14 1731)  predniSONE (DELTASONE) tablet 60 mg (60 mg Oral Given 01/29/14 1731)    Labs Review Labs Reviewed  CBC WITH DIFFERENTIAL - Abnormal; Notable for the following:    WBC 14.8 (*)    Neutro Abs 10.5 (*)    Monocytes Absolute 1.1 (*)    Eosinophils Relative 7 (*)    Eosinophils Absolute 1.1 (*)    All other components within normal limits  BASIC METABOLIC PANEL - Abnormal; Notable for the following:    Glucose, Bld 120 (*)    All other components within normal limits  I-STAT TROPOININ, ED  I-STAT CG4 LACTIC ACID, ED    Imaging Review Dg Chest 2 View  01/29/2014   CLINICAL DATA:  Shortness of breath, cough, and congestion since last night. Upper respiratory infection 2 weeks ago. Low-grade fever.  EXAM: CHEST  2 VIEW  COMPARISON:  01/17/2014  FINDINGS: The cardiomediastinal silhouette is within normal limits. The lungs  are well inflated. There is mild central peribronchial thickening, more prominent than on the prior study. The lungs are otherwise clear. There is no evidence of pleural effusion or pneumothorax. No acute osseous abnormality is identified.  IMPRESSION: Mild bronchitic change.   Electronically Signed   By: Sebastian AcheAllen  Grady   On: 01/29/2014 14:10     EKG Interpretation None      MDM   Final diagnoses:  Cough  Shortness of breath   33 year old male with recent URI symptoms, seen in the ED on 01/17/2014. He was supposed to be started on abx, prednisone, but only filled prednisone due to cost of abx.  He reports continued productive cough and SOB.  On arrival, patient is tachypneic with obvious labored breathing.  He has diffuse inspiratory and expiratory wheezes throughout. He is tachycardic, likely from repeated doses of albuterol throughout the day today.  Patient given dose of prednisone and started on DuoNeb.  EKG sinus tachycardia without ischemic change. Labs with leukocytosis of 14.8, lactic acid WNL.  CXR with bronchitic changes.  Will monitor closely.  After initial DuoNeb, patient states some improvement. He continues  to have diffuse inspiratory and expiratory wheezes. We'll start her on hour-long continuous neb.  After hour-long neb, lung sounds are vastly improved with minimal residual end expiratory wheezes in his lower lobes. Patient initially ambulated with pulse ox, O2 sats maintained at 93%, respirations 26, pulse 120.  There was report of dizziness while ambulating.  Patient was allowed to rest and ambulated again, O2 sats maintained at 94%, HR 115, Resp 24-- patient denied any dizziness to me during either time he was ambulated.  Feel that patient might benefit from admission given his borderline VS at this time, however he states he is ready to go home so he can smoke.  I had a long discussion with patient about smoking and strongly recommended he quit-- he states he will consider it  but feels it is unlikely.  Feel majority of his symptoms are due to bronchitis and his chronic smoking.  Lower suspicion for ACS, PE, dissection, or other acute cardiac event at this time. He did not refill abx last time due to cost, i have found coupon for Levofloxacin for $9, will also represcribe steroids and additional albuterol inhaler given in the ED.  Discussed plan with patient, he/she acknowledged understanding and agreed with plan of care.  Return precautions given for new or worsening symptoms.  Patient remains mildly tachycardic at time of discharge, feel largely due to large doses of albuterol throughout the day today.  Garlon Hatchet, PA-C 01/29/14 2340  Layla Maw Ward, DO 01/29/14 2356

## 2014-03-07 ENCOUNTER — Emergency Department (HOSPITAL_COMMUNITY)
Admission: EM | Admit: 2014-03-07 | Discharge: 2014-03-07 | Disposition: A | Discharge status: Home / Self Care | Payer: Self-pay | Attending: Emergency Medicine | Admitting: Emergency Medicine

## 2014-03-07 ENCOUNTER — Encounter (HOSPITAL_COMMUNITY): Payer: Self-pay | Admitting: Emergency Medicine

## 2014-03-07 DIAGNOSIS — Z008 Encounter for other general examination: Secondary | ICD-10-CM | POA: Insufficient documentation

## 2014-03-07 DIAGNOSIS — F32A Depression, unspecified: Secondary | ICD-10-CM

## 2014-03-07 DIAGNOSIS — F329 Major depressive disorder, single episode, unspecified: Secondary | ICD-10-CM | POA: Insufficient documentation

## 2014-03-07 DIAGNOSIS — Z79899 Other long term (current) drug therapy: Secondary | ICD-10-CM | POA: Insufficient documentation

## 2014-03-07 DIAGNOSIS — Z720 Tobacco use: Secondary | ICD-10-CM | POA: Insufficient documentation

## 2014-03-07 LAB — COMPREHENSIVE METABOLIC PANEL
ALT: 38 U/L (ref 0–53)
AST: 32 U/L (ref 0–37)
Albumin: 4.5 g/dL (ref 3.5–5.2)
Alkaline Phosphatase: 61 U/L (ref 39–117)
Anion gap: 10 (ref 5–15)
BUN: 16 mg/dL (ref 6–23)
CO2: 27 mmol/L (ref 19–32)
Calcium: 9.6 mg/dL (ref 8.4–10.5)
Chloride: 102 mEq/L (ref 96–112)
Creatinine, Ser: 0.91 mg/dL (ref 0.50–1.35)
GFR calc Af Amer: >90 mL/min (ref >90)
GFR calc non Af Amer: >90 mL/min (ref >90)
Glucose, Bld: 135 mg/dL — ABNORMAL HIGH (ref 70–99)
Potassium: 3.8 mmol/L (ref 3.5–5.1)
Sodium: 139 mmol/L (ref 135–145)
Total Bilirubin: 0.6 mg/dL (ref 0.3–1.2)
Total Protein: 8.1 g/dL (ref 6.0–8.3)

## 2014-03-07 LAB — CBC
HCT: 46.5 % (ref 39.0–52.0)
Hemoglobin: 15.5 g/dL (ref 13.0–17.0)
MCH: 28.9 pg (ref 26.0–34.0)
MCHC: 33.3 g/dL (ref 30.0–36.0)
MCV: 86.8 fL (ref 78.0–100.0)
Platelets: 245 10*3/uL (ref 150–400)
RBC: 5.36 MIL/uL (ref 4.22–5.81)
RDW: 12.5 % (ref 11.5–15.5)
WBC: 9.5 10*3/uL (ref 4.0–10.5)

## 2014-03-07 LAB — ACETAMINOPHEN LEVEL: Acetaminophen (Tylenol), Serum: <10 ug/mL — ABNORMAL LOW (ref 10–30)

## 2014-03-07 LAB — ETHANOL: Alcohol, Ethyl (B): <5 mg/dL (ref 0–9)

## 2014-03-07 LAB — SALICYLATE LEVEL: Salicylate Lvl: <4 mg/dL (ref 2.8–20.0)

## 2014-03-07 NOTE — ED Notes (Addendum)
Pt reports feelings of SI without a plan, increase in depression since quitting medication "months ago" and loss of support with divorce. Pt denies HI, AVH, or substance/EtOH abuse. Pt a&ox4, poor eye contact, calm and cooperative in triage.

## 2014-03-13 NOTE — ED Provider Notes (Signed)
CSN: [id number]     Arrival date & time 03/07/14  1933 History   First MD Initiated Contact with Patient 03/07/14 2048     Chief Complaint  Patient presents with  . Medical Clearance     (Consider location/radiation/quality/duration/timing/severity/associated sxs/prior Treatment) HPI  34 year old male with increasing depression. Multiple stressors. Relationship issues with his divorced wife. Essentially he has no relationship with his children. Financial stressors. Denies any drug use. He denies having suicidal ideation but does endorse some feelings of hopelessness really doesn't care if he lives or dies. No homicidal ideation. No hallucinations.  Past Medical History  Diagnosis Date  . FH: mental illness    Past Surgical History  Procedure Laterality Date  . Toe surgery     History reviewed. No pertinent family history. History  Substance Use Topics  . Smoking status: Current Every Day Smoker -- 0.50 packs/day    Types: Cigarettes  . Smokeless tobacco: Current User  . Alcohol Use: No    Review of Systems  All systems reviewed and negative, other than as noted in HPI.   Allergies  Review of patient's allergies indicates no known allergies.  Home Medications   Prior to Admission medications   Medication Sig Start Date End Date Taking? Authorizing Provider  ARIPiprazole (ABILIFY) 10 MG tablet Take 10 mg by mouth daily.    Historical Provider, MD  FLUoxetine (PROZAC) 40 MG capsule Take 40 mg by mouth daily.    Historical Provider, MD  levofloxacin (LEVAQUIN) 750 MG tablet Take 1 tablet (750 mg total) by mouth daily. Patient not taking: Reported on 03/07/2014 01/29/14   Garlon Hatchet, PA-C  predniSONE (DELTASONE) 20 MG tablet Take 2 tablets (40 mg total) by mouth daily. Take 40 mg by mouth daily for 3 days, then  by mouth daily for 3 days, then  daily for 3 days Patient not taking: Reported on 03/07/2014 01/29/14   Garlon Hatchet, PA-C  pseudoephedrine  (SUDAFED) 60 MG tablet Take 1 tablet (60 mg total) by mouth every 4 (four) hours as needed for congestion. Patient not taking: Reported on 01/29/2014 12/04/12   Ramon Dredge Merrell, PA-C   BP 120/82 mmHg  Pulse 89  Temp(Src) 98.8 F (37.1 C) (Oral)  Resp 16  Ht  (1.702 m)  Wt 225 lb (102.059 kg)  BMI 35.23 kg/m2  SpO2 98% Physical Exam  Constitutional: He appears well-developed and well-nourished. No distress.  HENT:  Head: Normocephalic and atraumatic.  Eyes: Conjunctivae are normal. Right eye exhibits no discharge. Left eye exhibits no discharge.  Neck: Neck supple.  Cardiovascular: Normal rate, regular rhythm and normal heart sounds.  Exam reveals no gallop and no friction rub.   No murmur heard. Pulmonary/Chest: Effort normal and breath sounds normal. No respiratory distress.  Abdominal: Soft. He exhibits no distension. There is no tenderness.  Musculoskeletal: He exhibits no edema or tenderness.  Neurological: He is alert.  Skin: Skin is warm and dry.  Psychiatric: His behavior is normal. Thought content normal.  Flat affect. Speech is clear. Content appropriate. Decent insight. Does not appear to be responding to internal stimuli.  Nursing note and vitals reviewed.   ED Course  Procedures (including critical care time) Labs Review Labs Reviewed  ACETAMINOPHEN LEVEL - Abnormal; Notable for the following:    Acetaminophen (Tylenol), Serum <10.0 (*)    All other components within normal limits  COMPREHENSIVE METABOLIC PANEL - Abnormal; Notable for the following:    Glucose, Bld 135 (*)  All other components within normal limits  CBC  ETHANOL  SALICYLATE LEVEL    Imaging Review No results found.   EKG Interpretation None      MDM   Final diagnoses:  None    34 year old male with depression. He states he doesn't care if he dies but has no plans to actually harm himself. Shortly after my evaluation patient decided he did not want to wait for TTS  evaluation. I do not feel that I have a basis to involuntarily commit him. He was discharged in no acute distress.    Raeford Razor, MD 03/13/14 [phone]

## 2017-07-16 ENCOUNTER — Encounter (HOSPITAL_COMMUNITY): Payer: Self-pay | Admitting: Emergency Medicine

## 2017-07-16 ENCOUNTER — Emergency Department (HOSPITAL_COMMUNITY)
Admission: EM | Admit: 2017-07-16 | Discharge: 2017-07-17 | Disposition: A | Discharge status: Home / Self Care | Payer: Self-pay | Attending: Emergency Medicine | Admitting: Emergency Medicine

## 2017-07-16 DIAGNOSIS — Z79899 Other long term (current) drug therapy: Secondary | ICD-10-CM | POA: Insufficient documentation

## 2017-07-16 DIAGNOSIS — R682 Dry mouth, unspecified: Secondary | ICD-10-CM | POA: Insufficient documentation

## 2017-07-16 DIAGNOSIS — F17210 Nicotine dependence, cigarettes, uncomplicated: Secondary | ICD-10-CM | POA: Insufficient documentation

## 2017-07-16 LAB — CBC
HEMATOCRIT: 40.8 % (ref 39.0–52.0)
HEMOGLOBIN: 13.6 g/dL (ref 13.0–17.0)
MCH: 29.5 pg (ref 26.0–34.0)
MCHC: 33.3 g/dL (ref 30.0–36.0)
MCV: 88.5 fL (ref 78.0–100.0)
Platelets: 228 10*3/uL (ref 150–400)
RBC: 4.61 MIL/uL (ref 4.22–5.81)
RDW: 13.4 % (ref 11.5–15.5)
WBC: 9.5 10*3/uL (ref 4.0–10.5)

## 2017-07-16 NOTE — ED Triage Notes (Signed)
Pt presents stating "Im dehydrated"; when I asked patient to elaborate he said "I drink a lot of soda"; I asked him if he was having any symptoms of dehydration and he replied "I have a dry mouth"

## 2017-07-17 LAB — COMPREHENSIVE METABOLIC PANEL
ALT: 44 U/L (ref 17–63)
AST: 30 U/L (ref 15–41)
Albumin: 3.9 g/dL (ref 3.5–5.0)
Alkaline Phosphatase: 64 U/L (ref 38–126)
Anion gap: 10 (ref 5–15)
BUN: 10 mg/dL (ref 6–20)
CO2: 24 mmol/L (ref 22–32)
Calcium: 9.1 mg/dL (ref 8.9–10.3)
Chloride: 104 mmol/L (ref 101–111)
Creatinine, Ser: 0.91 mg/dL (ref 0.61–1.24)
GFR calc non Af Amer: >60 mL/min (ref >60)
Glucose, Bld: 136 mg/dL — ABNORMAL HIGH (ref 65–99)
Potassium: 3.4 mmol/L — ABNORMAL LOW (ref 3.5–5.1)
SODIUM: 138 mmol/L (ref 135–145)
Total Bilirubin: 0.5 mg/dL (ref 0.3–1.2)
Total Protein: 7.1 g/dL (ref 6.5–8.1)

## 2017-07-17 LAB — URINALYSIS, ROUTINE W REFLEX MICROSCOPIC
BACTERIA UA: NONE SEEN
BILIRUBIN URINE: NEGATIVE
Glucose, UA: NEGATIVE mg/dL
KETONES UR: NEGATIVE mg/dL
Leukocytes, UA: NEGATIVE
Nitrite: NEGATIVE
Protein, ur: NEGATIVE mg/dL
Specific Gravity, Urine: 1.024 (ref 1.005–1.030)
pH: 6 (ref 5.0–8.0)

## 2017-07-17 LAB — LIPASE, BLOOD: Lipase: 25 U/L (ref 11–51)

## 2017-07-17 NOTE — Discharge Instructions (Addendum)
Your evaluation today is very reassuring, labs do not show signs of severe dehydration, and your exam very reassuring.  Please continue to drink water, this will hydrate for more than Dr. Reino Kent will.  You will need to follow-up with the [facility] for continued evaluation.  If you develop fevers, nausea, vomiting, chest pain, shortness of breath, or abdominal pain or unable to drink or keep down fluids or any other new or concerning symptoms please return to the emergency department for reevaluation.

## 2017-07-17 NOTE — ED Notes (Signed)
Patient left at this time with all belongings. 

## 2017-07-17 NOTE — ED Provider Notes (Signed)
[facility] EMERGENCY DEPARTMENT Provider Note   CSN: [id number] Arrival date & time: 07/16/17  2151     History   Chief Complaint Chief Complaint  Patient presents with  . Dry Mouth    HPI Stephen Ramsey is a 37 y.o. male.  Stephen Ramsey is a 37 y.o. Male who presents to the ED for evaluation of dehydration.  Patient reports his mouth feels dry, and he reports he drinks a lot of Dr. Reino Kent.  Patient reports he drinks that instead of water because "that is my drink".  Patient denies any nausea, vomiting or diarrhea, no abdominal pain, chest pain or shortness of breath.  Patient denies any lightheadedness or syncope.  Patient reports he has been drinking water in the waiting room without difficulty but repeatedly reports he has a dry mouth and does not think that he can drink enough water to hydrate himself.      Past Medical History:  Diagnosis Date  . FH: mental illness     There are no active problems to display for this patient.   Past Surgical History:  Procedure Laterality Date  . TOE SURGERY          Home Medications    Prior to Admission medications   Medication Sig Start Date End Date Taking? Authorizing Provider  ARIPiprazole (ABILIFY) 10 MG tablet Take 10 mg by mouth daily.    [provider]  FLUoxetine (PROZAC) 40 MG capsule Take 40 mg by mouth daily.    [provider]  levofloxacin (LEVAQUIN) 750 MG tablet Take 1 tablet (750 mg total) by mouth daily. Patient not taking: Reported on 03/07/2014 01/29/14   Garlon Hatchet, PA-C  predniSONE (DELTASONE) 20 MG tablet Take 2 tablets (40 mg total) by mouth daily. Take 40 mg by mouth daily for 3 days, then  by mouth daily for 3 days, then  daily for 3 days Patient not taking: Reported on 03/07/2014 01/29/14   Garlon Hatchet, PA-C  pseudoephedrine (SUDAFED) 60 MG tablet Take 1 tablet (60 mg total) by mouth every 4 (four) hours as needed for congestion. Patient not  taking: Reported on 01/29/2014 12/04/12   Junious Silk, PA-C    Family History History reviewed. No pertinent family history.  Social History Social History   Tobacco Use  . Smoking status: Current Every Day Smoker    Packs/day: 0.50    Types: Cigarettes  . Smokeless tobacco: Current User  Substance Use Topics  . Alcohol use: No  . Drug use: No     Allergies   Patient has no known allergies.   Review of Systems Review of Systems  Constitutional: Negative for chills and fever.  HENT: Negative for congestion, rhinorrhea and sore throat.   Eyes: Negative for visual disturbance.  Respiratory: Negative for cough and shortness of breath.   Cardiovascular: Negative for chest pain.  Gastrointestinal: Negative for abdominal pain, diarrhea, nausea and vomiting.  Genitourinary: Negative for dysuria.  Musculoskeletal: Negative for arthralgias and myalgias.  Skin: Negative for color change and rash.  Neurological: Negative for dizziness, syncope and light-headedness.     Physical Exam Updated Vital Signs BP 110/79 (BP Location: Right Arm)   Pulse 82   Temp 98.2 F (36.8 C) (Oral)   Resp 18   Ht  (1.702 m)   Wt 108.9 kg (240 lb)   SpO2 100%   BMI 37.59 kg/m   Physical Exam  Constitutional: He appears well-developed and well-nourished.  No distress.  HENT:  Head: Normocephalic and atraumatic.  Mouth/Throat: Oropharynx is clear and moist.  Mucous membranes moist with saliva present, patient swallowing saliva without difficulty  Eyes: Right eye exhibits no discharge. Left eye exhibits no discharge.  Neck: Neck supple.  Cardiovascular: Normal rate, regular rhythm, normal heart sounds and intact distal pulses.  Pulmonary/Chest: Effort normal and breath sounds normal. No stridor. No respiratory distress. He has no wheezes. He has no rales.  Respirations equal and unlabored, patient able to speak in full sentences, lungs clear to auscultation bilaterally  Abdominal:  Soft. Bowel sounds are normal. He exhibits no distension and no mass. There is no tenderness. There is no guarding.  Neurological: He is alert. Coordination normal.  Skin: Skin is warm and dry. Capillary refill takes less than 2 seconds. He is not diaphoretic.  Psychiatric: He has a normal mood and affect. His behavior is normal.  Nursing note and vitals reviewed.    ED Treatments / Results  Labs (all labs ordered are listed, but only abnormal results are displayed) Labs Reviewed  COMPREHENSIVE METABOLIC PANEL - Abnormal; Notable for the following components:      Result Value   Potassium 3.4 (*)    Glucose, Bld 136 (*)    All other components within normal limits  URINALYSIS, ROUTINE W REFLEX MICROSCOPIC - Abnormal; Notable for the following components:   Hgb urine dipstick SMALL (*)    All other components within normal limits  LIPASE, BLOOD  CBC    EKG None  Radiology No results found.  Procedures Procedures (including critical care time)  Medications Ordered in ED Medications - No data to display   Initial Impression / Assessment and Plan / ED Course  I have reviewed the triage vital signs and the nursing notes.  Pertinent labs & imaging results that were available during my care of the patient were reviewed by me and considered in my medical decision making (see chart for details).  Patient presents to the ED for evaluation of dehydration and dry mouth.  He has normal vitals and is in no acute distress.  He is not been vomiting or had any diarrhea, patient has been able to drink water without difficulty here in the waiting room.  Exam is unremarkable.  Labs obtained from triage and are very reassuring, no leukocytosis, normal hemoglobin, no electrolyte derangements requiring intervention, glucose is slightly elevated at 136 but patient was drinking Dr. Reino Kent prior to this being taken, normal creatinine and BUN, normal liver function, urinalysis without signs of  infection, no ketones or protein to suggest dehydration.   At this time there does not appear to be any evidence of an acute emergency medical condition and the patient appears stable for discharge with appropriate outpatient follow up.  Reassuring work-up discussed with patient at length.  Patient continues to express concern for his dry mouth, but reports he has been drinking water without difficulty in the waiting room.  Final Clinical Impressions(s) / ED Diagnoses   Final diagnoses:  Dry mouth    ED Discharge Orders    None       Dartha Lodge, [place] 07/17/17 4098    Shon Baton, MD 07/17/17 [phone]

## 2017-07-17 NOTE — ED Notes (Signed)
ED Provider at bedside. 

## 2017-07-19 ENCOUNTER — Emergency Department (HOSPITAL_COMMUNITY): Payer: Self-pay

## 2017-07-19 ENCOUNTER — Encounter (HOSPITAL_COMMUNITY): Payer: Self-pay | Admitting: Emergency Medicine

## 2017-07-19 ENCOUNTER — Other Ambulatory Visit: Payer: Self-pay

## 2017-07-19 ENCOUNTER — Emergency Department (HOSPITAL_COMMUNITY)
Admission: EM | Admit: 2017-07-19 | Discharge: 2017-07-19 | Disposition: A | Discharge status: Home / Self Care | Payer: Self-pay | Attending: Emergency Medicine | Admitting: Emergency Medicine

## 2017-07-19 DIAGNOSIS — Z79899 Other long term (current) drug therapy: Secondary | ICD-10-CM | POA: Insufficient documentation

## 2017-07-19 DIAGNOSIS — M25522 Pain in left elbow: Secondary | ICD-10-CM | POA: Insufficient documentation

## 2017-07-19 DIAGNOSIS — F17210 Nicotine dependence, cigarettes, uncomplicated: Secondary | ICD-10-CM | POA: Insufficient documentation

## 2017-07-19 MED ORDER — NAPROXEN 375 MG PO TABS: 375.0000 mg | ORAL_TABLET | Freq: Two times a day (BID) | ORAL | 0 refills | Status: DC
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
## 2017-07-19 NOTE — ED Provider Notes (Signed)
[facility] EMERGENCY DEPARTMENT Provider Note   CSN: [id number] Arrival date & time: 07/19/17  1413     History   Chief Complaint Chief Complaint  Patient presents with  . Elbow Pain    HPI Stephen Ramsey is a 37 y.o. male.  Patient states he injured his left elbow during a take down by an [employer] surveyor. Describes hyperextension type injury. He is complaining of pain overlying the posterior elbow, mild exacerbation with flexion and extension. No obvious bruising or swelling noted.  The history is provided by the patient. No language interpreter was used.  Arm Injury   This is a new problem. The problem has been gradually improving. The pain is present in the left elbow. The quality of the pain is described as aching. The pain is mild. Associated symptoms include stiffness. Pertinent negatives include no numbness and full range of motion. He has tried nothing for the symptoms.    Past Medical History:  Diagnosis Date  . FH: mental illness     There are no active problems to display for this patient.   Past Surgical History:  Procedure Laterality Date  . TOE SURGERY          Home Medications    Prior to Admission medications   Medication Sig Start Date End Date Taking? Authorizing Provider  ARIPiprazole (ABILIFY) 10 MG tablet Take 10 mg by mouth daily.    [provider]  FLUoxetine (PROZAC) 40 MG capsule Take 40 mg by mouth daily.    [provider]  levofloxacin (LEVAQUIN) 750 MG tablet Take 1 tablet (750 mg total) by mouth daily. Patient not taking: Reported on 03/07/2014 01/29/14   Garlon Hatchet, PA-C  predniSONE (DELTASONE) 20 MG tablet Take 2 tablets (40 mg total) by mouth daily. Take 40 mg by mouth daily for 3 days, then  by mouth daily for 3 days, then  daily for 3 days Patient not taking: Reported on 03/07/2014 01/29/14   Garlon Hatchet, PA-C  pseudoephedrine (SUDAFED) 60 MG tablet Take 1 tablet (60  mg total) by mouth every 4 (four) hours as needed for congestion. Patient not taking: Reported on 01/29/2014 12/04/12   Junious Silk, PA-C    Family History No family history on file.  Social History Social History   Tobacco Use  . Smoking status: Current Every Day Smoker    Packs/day: 0.50    Types: Cigarettes  . Smokeless tobacco: Current User  Substance Use Topics  . Alcohol use: No  . Drug use: No     Allergies   Patient has no known allergies.   Review of Systems Review of Systems  Musculoskeletal: Positive for arthralgias and stiffness.  Neurological: Negative for numbness.  All other systems reviewed and are negative.    Physical Exam Updated Vital Signs BP 116/68 (BP Location: Left Wrist)   Pulse (!) 59   Temp 98.9 F (37.2 C) (Oral)   Resp 16   SpO2 100%   Physical Exam  Constitutional: He is oriented to person, place, and time. He appears well-nourished. No distress.  HENT:  Head: Atraumatic.  Eyes: Conjunctivae are normal.  Neck: Neck supple.  Cardiovascular: Normal rate and regular rhythm.  Pulmonary/Chest: Effort normal and breath sounds normal.  Musculoskeletal: Normal range of motion. He exhibits tenderness. He exhibits no edema or deformity.       Left elbow: He exhibits normal range of motion, no effusion and no deformity. Tenderness found.  Arms: Neurological: He is alert and oriented to person, place, and time. No sensory deficit.  Skin: Skin is warm and dry.  Psychiatric: He has a normal mood and affect.  Nursing note and vitals reviewed.    ED Treatments / Results  Labs (all labs ordered are listed, but only abnormal results are displayed) Labs Reviewed - No data to display  EKG None  Radiology Dg Elbow Complete Left  Result Date: 07/19/2017 CLINICAL DATA:  Altercation with police.  Elbow pain. EXAM: LEFT ELBOW - COMPLETE 3+ VIEW COMPARISON:  None. FINDINGS: There is no evidence of fracture, dislocation, or joint  effusion. There is no evidence of arthropathy or other focal bone abnormality. Soft tissues are unremarkable. IMPRESSION: Negative. Electronically Signed   By: Elsie Stain M.D.   On: 07/19/2017 15:20    Procedures Procedures (including critical care time)  Medications Ordered in ED Medications - No data to display   Initial Impression / Assessment and Plan / ED Course  I have reviewed the triage vital signs and the nursing notes.  Pertinent labs & imaging results that were available during my care of the patient were reviewed by me and considered in my medical decision making (see chart for details).     Patient X-Ray negative for obvious fracture or dislocation.  Pt advised to follow up with orthopedics. Patient given sling while in ED, conservative therapy recommended and discussed. Patient will be discharged home & is agreeable with above plan. Returns precautions discussed. Pt appears safe for discharge.  Final Clinical Impressions(s) / ED Diagnoses   Final diagnoses:  Left elbow pain    ED Discharge Orders        Ordered    naproxen (NAPROSYN) 375 MG tablet  2 times daily     07/19/17 1722       Felicie Morn, NP 07/19/17 1723    Vanetta Mulders, MD 07/25/17 [phone]

## 2017-07-29 ENCOUNTER — Other Ambulatory Visit: Payer: Self-pay

## 2017-07-29 ENCOUNTER — Emergency Department (HOSPITAL_COMMUNITY)
Admission: EM | Admit: 2017-07-29 | Discharge: 2017-07-29 | Disposition: A | Discharge status: Home / Self Care | Payer: Self-pay | Attending: Emergency Medicine | Admitting: Emergency Medicine

## 2017-07-29 ENCOUNTER — Encounter (HOSPITAL_COMMUNITY): Payer: Self-pay | Admitting: Obstetrics and Gynecology

## 2017-07-29 DIAGNOSIS — Y929 Unspecified place or not applicable: Secondary | ICD-10-CM | POA: Insufficient documentation

## 2017-07-29 DIAGNOSIS — X58XXXA Exposure to other specified factors, initial encounter: Secondary | ICD-10-CM | POA: Insufficient documentation

## 2017-07-29 DIAGNOSIS — Z79899 Other long term (current) drug therapy: Secondary | ICD-10-CM | POA: Insufficient documentation

## 2017-07-29 DIAGNOSIS — Y999 Unspecified external cause status: Secondary | ICD-10-CM | POA: Insufficient documentation

## 2017-07-29 DIAGNOSIS — S91114A Laceration without foreign body of right lesser toe(s) without damage to nail, initial encounter: Secondary | ICD-10-CM

## 2017-07-29 DIAGNOSIS — F17210 Nicotine dependence, cigarettes, uncomplicated: Secondary | ICD-10-CM | POA: Insufficient documentation

## 2017-07-29 DIAGNOSIS — Y939 Activity, unspecified: Secondary | ICD-10-CM | POA: Insufficient documentation

## 2017-07-29 MED ORDER — BACITRACIN ZINC 500 UNIT/GM EX OINT
TOPICAL_OINTMENT | Freq: Two times a day (BID) | CUTANEOUS | Status: DC
  Administered 2017-07-29: 1 via TOPICAL

## 2017-07-29 NOTE — ED Provider Notes (Signed)
[facility]-EMERGENCY DEPT Provider Note   CSN: [id number] Arrival date & time: 07/29/17  1458     History   Chief Complaint Chief Complaint  Patient presents with  . Toe Pain    HPI Stephen Ramsey is a 37 y.o. male.  Stephen Ramsey is a 37 y.o. Male with a history of tobacco abuse, otherwise healthy, presents the emergency department for evaluation of a at the base of the right fifth toe.  Patient reports he is frequently in work boots with thick socks, and his foot does not get very much air and the skin tore at the base of the toe.  No injury or trauma to the toe.  He reports when he keeps a gauze over it is not painful but when it is left to the opening or it is mildly painful and burns a little bit.  Patient denies any surrounding swelling, redness or warmth no drainage from the area.  No fevers or chills.  No other wounds reported over the foot.  Patient is not diabetic.  Patient able to move all of his toes without difficulty, no numbness or tingling.     Past Medical History:  Diagnosis Date  . FH: mental illness     There are no active problems to display for this patient.   Past Surgical History:  Procedure Laterality Date  . TOE SURGERY          Home Medications    Prior to Admission medications   Medication Sig Start Date End Date Taking? Authorizing Provider  ARIPiprazole (ABILIFY) 10 MG tablet Take 10 mg by mouth daily.    [provider]  FLUoxetine (PROZAC) 40 MG capsule Take 40 mg by mouth daily.    [provider]  levofloxacin (LEVAQUIN) 750 MG tablet Take 1 tablet (750 mg total) by mouth daily. Patient not taking: Reported on 03/07/2014 01/29/14   Garlon Hatchet, PA-C  naproxen (NAPROSYN) 375 MG tablet Take 1 tablet (375 mg total) by mouth 2 (two) times daily. 07/19/17   Felicie Morn, NP  predniSONE (DELTASONE) 20 MG tablet Take 2 tablets (40 mg total) by mouth daily. Take 40 mg by mouth daily for 3 days,  then  by mouth daily for 3 days, then  daily for 3 days Patient not taking: Reported on 03/07/2014 01/29/14   Garlon Hatchet, PA-C  pseudoephedrine (SUDAFED) 60 MG tablet Take 1 tablet (60 mg total) by mouth every 4 (four) hours as needed for congestion. Patient not taking: Reported on 01/29/2014 12/04/12   Junious Silk, PA-C    Family History No family history on file.  Social History Social History   Tobacco Use  . Smoking status: Current Every Day Smoker    Packs/day: 0.50    Types: Cigarettes  . Smokeless tobacco: Current User    Types: Chew  Substance Use Topics  . Alcohol use: Yes    Comment: Pt reports social drinking  . Drug use: No     Allergies   Patient has no known allergies.   Review of Systems Review of Systems  Constitutional: Negative for chills and fever.  Musculoskeletal: Negative for arthralgias and joint swelling.  Skin: Positive for wound. Negative for color change and rash.  Neurological: Negative for weakness and numbness.     Physical Exam Updated Vital Signs Temp 98.3 F (36.8 C) (Oral)   Ht  (1.702 m)   Wt 108.9 kg (240 lb)   BMI 37.59  kg/m   Physical Exam  Constitutional: He appears well-developed and well-nourished. No distress.  HENT:  Head: Normocephalic and atraumatic.  Eyes: Right eye exhibits no discharge. Left eye exhibits no discharge.  Pulmonary/Chest: Effort normal. No respiratory distress.  Musculoskeletal:  There is a 1 cm laceration at the base of the right fifth toe, this laceration is very superficial, and appears to be a small tear in the epidermal layer of skin, there is no surrounding erythema or warmth, no drainage, normal range of motion of the toe, no bony tenderness or evidence of nailbed damage, good capillary refill and sensation intact, no other wounds on the foot noted.  Neurological: He is alert. Coordination normal.  Skin: Skin is warm and dry. Capillary refill takes less than 2 seconds. He  is not diaphoretic.  Psychiatric: He has a normal mood and affect. His behavior is normal.  Nursing note and vitals reviewed.    ED Treatments / Results  Labs (all labs ordered are listed, but only abnormal results are displayed) Labs Reviewed - No data to display  EKG None  Radiology No results found.  Procedures Procedures (including critical care time)  Medications Ordered in ED Medications  bacitracin ointment (1 application Topical Given 07/29/17 1606)     Initial Impression / Assessment and Plan / ED Course  I have reviewed the triage vital signs and the nursing notes.  Pertinent labs & imaging results that were available during my care of the patient were reviewed by me and considered in my medical decision making (see chart for details).  Small laceration at the base of the right fifth toe, likely due to increased moisture from wearing work boots frequently this appears to be a small tear in the most superficial layer of skin, there is no bleeding, no surrounding signs of infection, will dressed with bacitracin gauze and bandage, encourage patient to continue to do the same and that should heal on its own.  Return precautions discussed.  Patient expresses understanding and is in agreement with plan.  Final Clinical Impressions(s) / ED Diagnoses   Final diagnoses:  Laceration of lesser toe of right foot without damage to nail, foreign body presence unspecified, initial encounter    ED Discharge Orders    None       Legrand Rams 07/29/17 1721    Lorre Nick, MD 07/31/17 [phone]

## 2017-07-29 NOTE — Discharge Instructions (Addendum)
This area should heal easily on its own without sutures or any additional wound repair, use bacitracin ointment and keep area clean and dry, try and change your socks frequently to keep the foot dry, and let her foot get air whenever available.  Return to the emergency department if you have redness, swelling or drainage from the area or any other new or concerning symptoms develop.

## 2017-07-29 NOTE — ED Triage Notes (Signed)
Per pt: Pt reports that he wears his boots all the time and he reports that his feet don't have time to "air out" and reports his small toe on the right foot has a laceration where the base of the toe meets the pad of his feet.

## 2017-09-24 ENCOUNTER — Encounter: Payer: Self-pay | Admitting: Pediatric Intensive Care

## 2017-09-24 NOTE — Congregational Nurse Program (Signed)
Congregational Nurse Program Note  Date of Encounter: 09/24/2017  Past Medical History: Past Medical History:  Diagnosis Date  . FH: mental illness     Encounter Details: CNP Questionnaire - 09/24/17 1000      Questionnaire   Patient Status  Not Applicable    Race  White or Caucasian    Location Patient Served At  [employer]    Uninsured  Uninsured (NEW 1x/quarter)    Food  No food insecurities    Housing/Utilities  No permanent housing    Transportation  No transportation needs    Interpersonal Safety  Yes, feel physically and emotionally safe where you currently live    Medication  Yes, have medication insecurities;Provided medication assistance    Medical Provider  No    Referrals  Medication Assistance    ED Visit Averted  Not Applicable    Life-Saving Intervention Made  Not Applicable      Client states he usually [facility] meds at [facility] but does not have funds for tegretol at present. CN will transfer medication to [facility].

## 2017-10-03 ENCOUNTER — Emergency Department (HOSPITAL_COMMUNITY)
Admission: EM | Admit: 2017-10-03 | Discharge: 2017-10-03 | Disposition: A | Discharge status: Home / Self Care | Payer: Self-pay | Attending: Emergency Medicine | Admitting: Emergency Medicine

## 2017-10-03 ENCOUNTER — Other Ambulatory Visit: Payer: Self-pay

## 2017-10-03 ENCOUNTER — Emergency Department (HOSPITAL_COMMUNITY): Payer: Self-pay

## 2017-10-03 DIAGNOSIS — F17210 Nicotine dependence, cigarettes, uncomplicated: Secondary | ICD-10-CM | POA: Insufficient documentation

## 2017-10-03 DIAGNOSIS — Z79899 Other long term (current) drug therapy: Secondary | ICD-10-CM | POA: Insufficient documentation

## 2017-10-03 DIAGNOSIS — M25551 Pain in right hip: Secondary | ICD-10-CM | POA: Insufficient documentation

## 2017-10-03 MED ORDER — METHOCARBAMOL 500 MG PO TABS
1000.0000 mg | ORAL_TABLET | Freq: Once | ORAL | Status: AC
  Administered 2017-10-03: 1000 mg via ORAL
  Filled 2017-10-03: qty 2

## 2017-10-03 MED ORDER — METHOCARBAMOL 750 MG PO TABS: 750.0000 mg | ORAL_TABLET | Freq: Four times a day (QID) | ORAL | 0 refills | Status: DC | PRN

## 2017-10-03 MED ORDER — METHOCARBAMOL 750 MG PO TABS: 750.0000 mg | ORAL_TABLET | Freq: Four times a day (QID) | ORAL | 0 refills | Status: AC | PRN

## 2017-10-03 NOTE — Discharge Instructions (Signed)
Your history and exam today are consistent with muscle spasms contributing to your pain.  On exam he had muscular tenderness.  Your x-ray did not show evidence of fracture or dislocation or other bony problem.  Please use the muscle relaxants to help with your symptoms for the next week.  Please follow-up with a primary care physician for reassessment further management.  If any symptoms change or worsen, please return to the nearest emergency department.

## 2017-10-03 NOTE — ED Notes (Signed)
ED Provider at bedside. 

## 2017-10-03 NOTE — ED Triage Notes (Signed)
[facility] Patient is complaining of right hip pain that started one year ago but has got worse tonight. Denies any recent injuries.

## 2017-10-03 NOTE — ED Notes (Signed)
Bed: WA04 Expected date:  Expected time:  Means of arrival:  Comments: 

## 2017-10-03 NOTE — ED Provider Notes (Signed)
[facility]-EMERGENCY DEPT Provider Note   CSN: [id number] Arrival date & time: 10/03/17  2039     History   Chief Complaint Chief Complaint  Patient presents with  . Hip Pain    HPI Stephen Ramsey is a 37 y.o. male.  The history is provided by the patient and medical records.  Hip Pain  This is a recurrent problem. The current episode started more than 1 week ago. The problem occurs daily. The problem has not changed (fluctuating) since onset.Pertinent negatives include no chest pain, no abdominal pain, no headaches and no shortness of breath. The symptoms are aggravated by standing and twisting. Nothing relieves the symptoms. He has tried nothing for the symptoms. The treatment provided no relief.    Past Medical History:  Diagnosis Date  . FH: mental illness     There are no active problems to display for this patient.   Past Surgical History:  Procedure Laterality Date  . TOE SURGERY          Home Medications    Prior to Admission medications   Medication Sig Start Date End Date Taking? Authorizing Provider  ARIPiprazole (ABILIFY) 10 MG tablet Take 10 mg by mouth daily.    [provider]  FLUoxetine (PROZAC) 40 MG capsule Take 40 mg by mouth daily.    [provider]  levofloxacin (LEVAQUIN) 750 MG tablet Take 1 tablet (750 mg total) by mouth daily. Patient not taking: Reported on 03/07/2014 01/29/14   Garlon HatchetSanders, Lisa M, PA-C  naproxen (NAPROSYN) 375 MG tablet Take 1 tablet (375 mg total) by mouth 2 (two) times daily. 07/19/17   Felicie MornSmith, David, NP  predniSONE (DELTASONE) 20 MG tablet Take 2 tablets (40 mg total) by mouth daily. Take 40 mg by mouth daily for 3 days, then 20mg  by mouth daily for 3 days, then 10mg  daily for 3 days Patient not taking: Reported on 03/07/2014 01/29/14   Garlon HatchetSanders, Lisa M, PA-C  pseudoephedrine (SUDAFED) 60 MG tablet Take 1 tablet (60 mg total) by mouth every 4 (four) hours as needed for  congestion. Patient not taking: Reported on 01/29/2014 12/04/12   Junious SilkMerrell, Hannah, PA-C    Family History No family history on file.  Social History Social History   Tobacco Use  . Smoking status: Current Every Day Smoker    Packs/day: 0.50    Types: Cigarettes  . Smokeless tobacco: Current User    Types: Chew  Substance Use Topics  . Alcohol use: Yes    Comment: Pt reports social drinking  . Drug use: No     Allergies   Patient has no known allergies.   Review of Systems Review of Systems  Constitutional: Negative for chills, diaphoresis and fatigue.  HENT: Negative for congestion.   Respiratory: Negative for cough, chest tightness, shortness of breath and wheezing.   Cardiovascular: Negative for chest pain.  Gastrointestinal: Negative for abdominal pain, constipation, diarrhea, nausea and vomiting.  Genitourinary: Negative for dysuria, flank pain and frequency.  Musculoskeletal: Negative for back pain, neck pain and neck stiffness.  Skin: Negative for rash and wound.  Neurological: Negative for seizures, speech difficulty, weakness, light-headedness, numbness and headaches.  Psychiatric/Behavioral: Negative for agitation.  All other systems reviewed and are negative.    Physical Exam Updated Vital Signs BP (!) 140/91 (BP Location: Left Arm)   Pulse 98   Temp 98.5 F (36.9 C) (Oral)   Resp 16   Ht 5\' 9"  (1.753 m)   Wt  108.9 kg (240 lb)   SpO2 97%   BMI 35.44 kg/m   Physical Exam  Constitutional: He is oriented to person, place, and time. He appears well-developed and well-nourished. No distress.  HENT:  Head: Normocephalic and atraumatic.  Mouth/Throat: Oropharynx is clear and moist.  Eyes: Pupils are equal, round, and reactive to light. Conjunctivae are normal.  Neck: Neck supple.  Cardiovascular: Normal rate and regular rhythm.  No murmur heard. Pulmonary/Chest: Effort normal and breath sounds normal. No respiratory distress. He has no wheezes. He  exhibits no tenderness.  Abdominal: Soft. There is no tenderness. There is no guarding.  Musculoskeletal: Normal range of motion. He exhibits tenderness. He exhibits no edema or deformity.       Right hip: He exhibits tenderness. He exhibits normal range of motion, normal strength, no bony tenderness, no swelling, no crepitus, no deformity and no laceration.       Legs: Normal sensation and strength in lower extremities.  Normal pulses and lower extremities.  No low back tenderness.  Tenderness present on the right gluteus and right thigh.  No edema seen.  Palpable spasm was felt.   Neurological: He is alert and oriented to person, place, and time. No cranial nerve deficit or sensory deficit. He exhibits normal muscle tone. Coordination normal.  Skin: Skin is warm and dry. Capillary refill takes less than 2 seconds. He is not diaphoretic. No erythema. No pallor.  Psychiatric: He has a normal mood and affect.  Nursing note and vitals reviewed.    ED Treatments / Results  Labs (all labs ordered are listed, but only abnormal results are displayed) Labs Reviewed - No data to display  EKG None  Radiology Dg Hip Unilat W Or Wo Pelvis 2-3 Views Right  Result Date: 10/03/2017 CLINICAL DATA:  No injury.  Posterior hip pain for over 1 year. EXAM: DG HIP (WITH OR WITHOUT PELVIS) 2-3V RIGHT COMPARISON:  None. FINDINGS: There is no evidence of hip fracture or dislocation. There is no evidence of arthropathy or other focal bone abnormality. IMPRESSION: Negative. Electronically Signed   By: Burman Nieves M.D.   On: 10/03/2017 21:53    Procedures Procedures (including critical care time)  Medications Ordered in ED Medications  methocarbamol (ROBAXIN) tablet 1,000 mg (1,000 mg Oral Given 10/03/17 2232)     Initial Impression / Assessment and Plan / ED Course  I have reviewed the triage vital signs and the nursing notes.  Pertinent labs & imaging results that were available during my care of  the patient were reviewed by me and considered in my medical decision making (see chart for details).     Stephen Ramsey is a 37 y.o. male with no significant past medical history who presents with right hip and upper leg pain.  Patient reports no trauma.  He reports that for the last 2 months he has been having intermittent pain in his right top and upper leg.  He reports that at times there is no pain but at times it feels like a sharp shooting pain.  He reports that when he is feeling it is worsened with bending, twisting, or walking.  He denies leg swelling, numbness, or tingling.  He denies any recent trauma.  He denies loss of bowel or bladder function.  He denies any upper back pain mid neck pain or low back pain.  He denies any nausea, vomiting, or other symptoms.  He describes his pain is moderate to severe when he feels it.  He denies any lacerations, skin changes, or other signs of infection.  On exam, patient has tenderness in his right gluteus and upper thigh.  Normal sensation and strength in legs.  Normal pulse.  Lungs clear chest nontender.  Exam otherwise unremarkable.  X-ray was obtained showing no evidence of bony abnormality such as dislocation, fracture, or other osseous problem.  Suspect musculoskeletal spasm and pain as muscle spasm was palpated.  Patient will be given Robaxin both in the ED and for prescription.  Patient will follow-up with a PCP for further management.  No red flags concerning for other significant etiology and patient had a normal neurovascular exam.  Patient understood return precautions and follow-up instructions.  Patient was discharged in good condition.     Final Clinical Impressions(s) / ED Diagnoses   Final diagnoses:  Right hip pain    ED Discharge Orders        Ordered    methocarbamol (ROBAXIN) 750 MG tablet  Every 6 hours PRN,   Status:  Discontinued     10/03/17 2222    methocarbamol (ROBAXIN) 750 MG tablet  Every 6 hours PRN      10/03/17 2228      Clinical Impression: 1. Right hip pain     Disposition: Discharge  Condition: Good  I have discussed the results, Dx and Tx plan with the pt(& family if present). He/she/they expressed understanding and agree(s) with the plan. Discharge instructions discussed at great length. Strict return precautions discussed and pt &/or family have verbalized understanding of the instructions. No further questions at time of discharge.    Discharge Medication List as of 10/03/2017 10:23 PM    START taking these medications   Details  methocarbamol (ROBAXIN) 750 MG tablet Take 1 tablet (750 mg total) by mouth every 6 (six) hours as needed for muscle spasms., Starting Wed 10/03/2017, Normal        Follow Up: [facility] [address] [place] [address] [phone] Schedule an appointment as soon as possible for a visit    [facility] [address] [id number] mc [place] [address] [phone]       Evens Meno, Canary Brim, MD 10/04/17 [phone]

## 2017-11-05 ENCOUNTER — Encounter (HOSPITAL_COMMUNITY): Payer: Self-pay

## 2017-11-05 ENCOUNTER — Other Ambulatory Visit: Payer: Self-pay

## 2017-11-05 ENCOUNTER — Emergency Department (HOSPITAL_COMMUNITY)
Admission: EM | Admit: 2017-11-05 | Discharge: 2017-11-06 | Disposition: A | Discharge status: Home / Self Care | Payer: Self-pay | Attending: Emergency Medicine | Admitting: Emergency Medicine

## 2017-11-05 DIAGNOSIS — T887XXA Unspecified adverse effect of drug or medicament, initial encounter: Secondary | ICD-10-CM | POA: Insufficient documentation

## 2017-11-05 DIAGNOSIS — Z79899 Other long term (current) drug therapy: Secondary | ICD-10-CM | POA: Insufficient documentation

## 2017-11-05 DIAGNOSIS — T50905A Adverse effect of unspecified drugs, medicaments and biological substances, initial encounter: Secondary | ICD-10-CM | POA: Insufficient documentation

## 2017-11-05 DIAGNOSIS — Y69 Unspecified misadventure during surgical and medical care: Secondary | ICD-10-CM | POA: Insufficient documentation

## 2017-11-05 DIAGNOSIS — F17220 Nicotine dependence, chewing tobacco, uncomplicated: Secondary | ICD-10-CM | POA: Insufficient documentation

## 2017-11-05 DIAGNOSIS — F17210 Nicotine dependence, cigarettes, uncomplicated: Secondary | ICD-10-CM | POA: Insufficient documentation

## 2017-11-05 DIAGNOSIS — R600 Localized edema: Secondary | ICD-10-CM | POA: Insufficient documentation

## 2017-11-05 LAB — BASIC METABOLIC PANEL
Anion gap: 8 (ref 5–15)
BUN: 6 mg/dL (ref 6–20)
CHLORIDE: 104 mmol/L (ref 98–111)
CO2: 29 mmol/L (ref 22–32)
CREATININE: 0.99 mg/dL (ref 0.61–1.24)
Calcium: 9.2 mg/dL (ref 8.9–10.3)
GFR calc Af Amer: >60 mL/min (ref >60)
GFR calc non Af Amer: >60 mL/min (ref >60)
Glucose, Bld: 116 mg/dL — ABNORMAL HIGH (ref 70–99)
Potassium: 3.6 mmol/L (ref 3.5–5.1)
Sodium: 141 mmol/L (ref 135–145)

## 2017-11-05 LAB — CBC
HEMATOCRIT: 39.3 % (ref 39.0–52.0)
Hemoglobin: 12.9 g/dL — ABNORMAL LOW (ref 13.0–17.0)
MCH: 29.3 pg (ref 26.0–34.0)
MCHC: 32.8 g/dL (ref 30.0–36.0)
MCV: 89.1 fL (ref 78.0–100.0)
PLATELETS: 207 10*3/uL (ref 150–400)
RBC: 4.41 MIL/uL (ref 4.22–5.81)
RDW: 12.7 % (ref 11.5–15.5)
WBC: 9.9 10*3/uL (ref 4.0–10.5)

## 2017-11-05 NOTE — ED Triage Notes (Signed)
Patient here for bilateral foot swelling that has been getting progressively worse over the last week.  On Tegretol and Lexapro for since July.  A&Ox4.  Non pitting edema noted.

## 2017-11-05 NOTE — Discharge Instructions (Addendum)
The swelling in the legs is most likely secondary to your Tegretol. However, it is recommended that you continue this medication until seen by the physician that prescribes it for any changes.

## 2017-11-05 NOTE — ED Provider Notes (Signed)
[facility] Provider Note   CSN: [id number] Arrival date & time: 11/05/17  2114     History   Chief Complaint Chief Complaint  Patient presents with  . Leg Swelling    HPI Stephen Ramsey is a 37 y.o. male.  Patient with history of mental illness (does not elaborate) presents with complaint of LE swelling that started one week ago. No significant pain. The swelling is constant without modifying factors, "its always there".  He denies SOB, CP, other sites of swelling. No history of same. He reports new medications started one month ago, namely Tegretol. No fever, ulcerations.   The history is provided by the patient. No language interpreter was used.    Past Medical History:  Diagnosis Date  . FH: mental illness     There are no active problems to display for this patient.   Past Surgical History:  Procedure Laterality Date  . TOE SURGERY          Home Medications    Prior to Admission medications   Medication Sig Start Date End Date Taking? Authorizing Provider  carbamazepine (TEGRETOL XR) 100 MG 12 hr tablet Take 100 mg by mouth 2 (two) times daily.    [provider]  escitalopram (LEXAPRO) 5 MG tablet Take 5 mg by mouth daily.    [provider]  hydrOXYzine (ATARAX/VISTARIL) 25 MG tablet Take 25 mg by mouth 2 (two) times daily as needed for anxiety.    [provider]  levofloxacin (LEVAQUIN) 750 MG tablet Take 1 tablet (750 mg total) by mouth daily. Patient not taking: Reported on 03/07/2014 01/29/14   Garlon HatchetSanders, Lisa M, PA-C  methocarbamol (ROBAXIN) 750 MG tablet Take 1 tablet (750 mg total) by mouth every 6 (six) hours as needed for muscle spasms. 10/03/17   Tegeler, Canary Brimhristopher J, MD  naproxen (NAPROSYN) 375 MG tablet Take 1 tablet (375 mg total) by mouth 2 (two) times daily. Patient not taking: Reported on 10/03/2017 07/19/17   Felicie MornSmith, David, NP  predniSONE (DELTASONE) 20 MG tablet Take 2 tablets  (40 mg total) by mouth daily. Take 40 mg by mouth daily for 3 days, then 20mg  by mouth daily for 3 days, then 10mg  daily for 3 days Patient not taking: Reported on 03/07/2014 01/29/14   Garlon HatchetSanders, Lisa M, PA-C  pseudoephedrine (SUDAFED) 60 MG tablet Take 1 tablet (60 mg total) by mouth every 4 (four) hours as needed for congestion. Patient not taking: Reported on 01/29/2014 12/04/12   Junious SilkMerrell, Hannah, PA-C    Family History History reviewed. No pertinent family history.  Social History Social History   Tobacco Use  . Smoking status: Current Every Day Smoker    Packs/day: 0.50    Types: Cigarettes  . Smokeless tobacco: Current User    Types: Chew  Substance Use Topics  . Alcohol use: Yes    Comment: Pt reports social drinking  . Drug use: No     Allergies   Patient has no known allergies.   Review of Systems Review of Systems  Constitutional: Negative for chills and fever.  Respiratory: Negative.  Negative for shortness of breath.   Cardiovascular: Positive for leg swelling. Negative for chest pain.  Gastrointestinal: Negative.   Musculoskeletal: Negative.   Skin: Negative.   Neurological: Negative.  Negative for numbness.     Physical Exam Updated Vital Signs BP 129/85 (BP Location: Right Arm)   Pulse 90   Temp 98.3 F (36.8 C) (Oral)  Resp 16   SpO2 97%   Physical Exam  Constitutional: He is oriented to person, place, and time. He appears well-developed and well-nourished.  Neck: Normal range of motion.  Pulmonary/Chest: Effort normal. No respiratory distress.  Musculoskeletal: Normal range of motion. He exhibits edema. He exhibits no tenderness.  Bilateral LE 2+ pretibial edema that include bilateral feet. No redness, drainage. Nontender.   Neurological: He is alert and oriented to person, place, and time.  Skin: Skin is warm and dry.  Psychiatric: He has a normal mood and affect.  Nursing note and vitals reviewed.    ED Treatments / Results  Labs (all  labs ordered are listed, but only abnormal results are displayed) Labs Reviewed  BASIC METABOLIC PANEL - Abnormal; Notable for the following components:      Result Value   Glucose, Bld 116 (*)    All other components within normal limits  CBC - Abnormal; Notable for the following components:   Hemoglobin 12.9 (*)    All other components within normal limits    EKG None  Radiology No results found.  Procedures Procedures (including critical care time)  Medications Ordered in ED Medications - No data to display   Initial Impression / Assessment and Plan / ED Course  I have reviewed the triage vital signs and the nursing notes.  Pertinent labs & imaging results that were available during my care of the patient were reviewed by me and considered in my medical decision making (see chart for details).     Patient here with bilateral LE edema x 1 week. Nothing makes it better or worse. No SOB, CP. No history of same.   He started Tegretol one month ago. Discussed with pharmacy regarding side effect of this medication being edema. He is on a lower dose (100 mg BID) but pharmacy reports SE of edema is not dose dependent.   Feel the edema is secondary to medication. No concern for CHF, renal dysfunction. Recommend follow up with prescribing physician prior to any medication change. Patient reassured.     Final Clinical Impressions(s) / ED Diagnoses   Final diagnoses:  None   1. Bilateral LE edema 2. Adverse medication reaction   ED Discharge Orders    None       Danne Harbor 11/05/17 2355    Dione Booze, MD 11/06/17 [id number]

## 2017-11-06 NOTE — ED Notes (Signed)
PT states understanding of care given, follow up care, and medication prescribed. PT has no more questions at this time. PT ambulated from ED to car with a steady gait.

## 2017-11-10 ENCOUNTER — Emergency Department (HOSPITAL_COMMUNITY)
Admission: EM | Admit: 2017-11-10 | Discharge: 2017-11-11 | Disposition: A | Discharge status: Home / Self Care | Payer: Self-pay | Attending: Emergency Medicine | Admitting: Emergency Medicine

## 2017-11-10 ENCOUNTER — Encounter (HOSPITAL_COMMUNITY): Payer: Self-pay | Admitting: Emergency Medicine

## 2017-11-10 ENCOUNTER — Other Ambulatory Visit: Payer: Self-pay

## 2017-11-10 DIAGNOSIS — R609 Edema, unspecified: Secondary | ICD-10-CM

## 2017-11-10 DIAGNOSIS — R600 Localized edema: Secondary | ICD-10-CM | POA: Insufficient documentation

## 2017-11-10 DIAGNOSIS — F17210 Nicotine dependence, cigarettes, uncomplicated: Secondary | ICD-10-CM | POA: Insufficient documentation

## 2017-11-10 DIAGNOSIS — J45909 Unspecified asthma, uncomplicated: Secondary | ICD-10-CM | POA: Insufficient documentation

## 2017-11-10 DIAGNOSIS — L309 Dermatitis, unspecified: Secondary | ICD-10-CM | POA: Insufficient documentation

## 2017-11-10 DIAGNOSIS — R21 Rash and other nonspecific skin eruption: Secondary | ICD-10-CM | POA: Insufficient documentation

## 2017-11-10 DIAGNOSIS — Z79899 Other long term (current) drug therapy: Secondary | ICD-10-CM | POA: Insufficient documentation

## 2017-11-10 HISTORY — DX: Post-traumatic stress disorder, unspecified: F43.10

## 2017-11-10 HISTORY — DX: Depression, unspecified: F32.A

## 2017-11-10 HISTORY — DX: Major depressive disorder, single episode, unspecified: F32.9

## 2017-11-10 HISTORY — DX: Unspecified asthma, uncomplicated: J45.909

## 2017-11-10 HISTORY — DX: Anxiety disorder, unspecified: F41.9

## 2017-11-10 LAB — COMPREHENSIVE METABOLIC PANEL
ALBUMIN: 4.1 g/dL (ref 3.5–5.0)
ALT: 38 U/L (ref 0–44)
ANION GAP: 8 (ref 5–15)
AST: 36 U/L (ref 15–41)
Alkaline Phosphatase: 81 U/L (ref 38–126)
BILIRUBIN TOTAL: 0.6 mg/dL (ref 0.3–1.2)
BUN: 9 mg/dL (ref 6–20)
CALCIUM: 9.5 mg/dL (ref 8.9–10.3)
CO2: 29 mmol/L (ref 22–32)
Chloride: 103 mmol/L (ref 98–111)
Creatinine, Ser: 0.79 mg/dL (ref 0.61–1.24)
GFR calc non Af Amer: >60 mL/min (ref >60)
GLUCOSE: 126 mg/dL — AB (ref 70–99)
Potassium: 3.9 mmol/L (ref 3.5–5.1)
Sodium: 140 mmol/L (ref 135–145)
TOTAL PROTEIN: 7.6 g/dL (ref 6.5–8.1)

## 2017-11-10 LAB — CBC WITH DIFFERENTIAL/PLATELET
BASOS PCT: 0 %
Basophils Absolute: 0 10*3/uL (ref 0.0–0.1)
EOS ABS: 0.9 10*3/uL — AB (ref 0.0–0.7)
Eosinophils Relative: 11 %
HEMATOCRIT: 39.1 % (ref 39.0–52.0)
Hemoglobin: 13.7 g/dL (ref 13.0–17.0)
Lymphocytes Relative: 30 %
Lymphs Abs: 2.5 10*3/uL (ref 0.7–4.0)
MCH: 30.2 pg (ref 26.0–34.0)
MCHC: 35 g/dL (ref 30.0–36.0)
MCV: 86.3 fL (ref 78.0–100.0)
MONO ABS: 0.5 10*3/uL (ref 0.1–1.0)
MONOS PCT: 6 %
Neutro Abs: 4.4 10*3/uL (ref 1.7–7.7)
Neutrophils Relative %: 53 %
Platelets: 222 10*3/uL (ref 150–400)
RBC: 4.53 MIL/uL (ref 4.22–5.81)
RDW: 13.3 % (ref 11.5–15.5)
WBC: 8.4 10*3/uL (ref 4.0–10.5)

## 2017-11-10 MED ORDER — FUROSEMIDE 20 MG PO TABS: 20.0000 mg | ORAL_TABLET | Freq: Every day | ORAL | 0 refills | Status: AC

## 2017-11-10 MED ORDER — PREDNISONE 20 MG PO TABS: ORAL_TABLET | ORAL | 0 refills | Status: AC

## 2017-11-10 MED ORDER — POTASSIUM CHLORIDE ER 10 MEQ PO TBCR: 10.0000 meq | EXTENDED_RELEASE_TABLET | Freq: Every day | ORAL | 0 refills | Status: AC

## 2017-11-10 NOTE — ED Triage Notes (Addendum)
Pt in c/o pain and swelling both feet and lower legs. Both legs are swollen with slight pitting edema in both feet. Pulses palpable. Pt stated that he does not have a hx of swelling prior to 10 days ago. Pt does not have a PCP. [facility] Rash and swelling start at calf level and redness extends to both feet

## 2017-11-10 NOTE — ED Notes (Signed)
Security at bedside to secure pocket knife

## 2017-11-10 NOTE — Discharge Instructions (Addendum)
Please schedule follow-up with your primary doctor for further consideration of this problem.

## 2017-11-10 NOTE — ED Provider Notes (Signed)
[facility]-EMERGENCY DEPT Provider Note   CSN: [id number] Arrival date & time: 11/10/17  2013     History   Chief Complaint Chief Complaint  Patient presents with  . Foot Pain  . Rash    HPI Stephen Ramsey is a 37 y.o. male.  Patient returns for persistent swelling of his feet and lower legs with rash.  Patient seen in the ER 5 days ago.  He reports that at that time he was told it might be his Tegretol, he has since stopped the Tegretol but symptoms have been persistent.  Rash is itchy and also painful.  No drainage.  It has not spread to any other parts of his body.     Past Medical History:  Diagnosis Date  . Anxiety   . Asthma   . Depression   . FH: mental illness   . PTSD (post-traumatic stress disorder)     There are no active problems to display for this patient.   Past Surgical History:  Procedure Laterality Date  . TOE SURGERY          Home Medications    Prior to Admission medications   Medication Sig Start Date End Date Taking? Authorizing Provider  carbamazepine (TEGRETOL XR) 100 MG 12 hr tablet Take 100 mg by mouth 2 (two) times daily.    [provider]  escitalopram (LEXAPRO) 5 MG tablet Take 5 mg by mouth daily.    [provider]  furosemide (LASIX) 20 MG tablet Take 1 tablet (20 mg total) by mouth daily. 11/10/17   Gilda Crease, MD  hydrOXYzine (ATARAX/VISTARIL) 25 MG tablet Take 25 mg by mouth 2 (two) times daily as needed for anxiety.    [provider]  methocarbamol (ROBAXIN) 750 MG tablet Take 1 tablet (750 mg total) by mouth every 6 (six) hours as needed for muscle spasms. 10/03/17   Tegeler, Canary Brim, MD  potassium chloride (K-DUR) 10 MEQ tablet Take 1 tablet (10 mEq total) by mouth daily. 11/10/17   Gilda Crease, MD  predniSONE (DELTASONE) 20 MG tablet 3 tabs po daily x 3 days, then 2 tabs x 3 days, then 1.5 tabs x 3 days, then 1 tab x 3 days, then 0.5 tabs x 3 days  11/10/17   Gilda Crease, MD    Family History Family History  Problem Relation Age of Onset  . Heart failure Mother   . Hypertension Mother   . Liver disease Father     Social History Social History   Tobacco Use  . Smoking status: Current Every Day Smoker    Packs/day: 0.50    Types: Cigarettes  . Smokeless tobacco: Current User    Types: Chew  Substance Use Topics  . Alcohol use: Yes    Comment: Pt reports social drinking  . Drug use: No     Allergies   Patient has no known allergies.   Review of Systems Review of Systems  Cardiovascular: Positive for leg swelling.  Skin: Positive for rash.  All other systems reviewed and are negative.    Physical Exam Updated Vital Signs BP (!) 101/56 (BP Location: Right Arm)   Pulse 79   Temp 98.6 F (37 C) (Oral)   Resp 16   Ht 5\' 8"  (1.727 m)   Wt 108.9 kg   SpO2 97%   BMI 36.49 kg/m   Physical Exam  Constitutional: He is oriented to person, place, and time. He appears well-developed  and well-nourished. No distress.  HENT:  Head: Normocephalic and atraumatic.  Right Ear: Hearing normal.  Left Ear: Hearing normal.  Nose: Nose normal.  Mouth/Throat: Oropharynx is clear and moist and mucous membranes are normal.  Eyes: Pupils are equal, round, and reactive to light. Conjunctivae and EOM are normal.  Neck: Normal range of motion. Neck supple.  Cardiovascular: Regular rhythm, S1 normal and S2 normal. Exam reveals no gallop and no friction rub.  No murmur heard. Pulmonary/Chest: Effort normal and breath sounds normal. No respiratory distress. He exhibits no tenderness.  Abdominal: Soft. Normal appearance and bowel sounds are normal. There is no hepatosplenomegaly. There is no tenderness. There is no rebound, no guarding, no tenderness at McBurney's point and negative Murphy's sign. No hernia.  Musculoskeletal: Normal range of motion. He exhibits edema (1+ pitting edema bilaterally).  Neurological: He is  alert and oriented to person, place, and time. He has normal strength. No cranial nerve deficit or sensory deficit. Coordination normal. GCS eye subscore is 4. GCS verbal subscore is 5. GCS motor subscore is 6.  Skin: Skin is warm, dry and intact. No rash (Scant maculopapular, slightly erythematous rash bilateral shins.  Confluent, slightly raised, erythematous rash on dorsal aspect of both feet) noted. No cyanosis.  Psychiatric: He has a normal mood and affect. His speech is normal and behavior is normal. Thought content normal.  Nursing note and vitals reviewed.    ED Treatments / Results  Labs (all labs ordered are listed, but only abnormal results are displayed) Labs Reviewed  CBC WITH DIFFERENTIAL/PLATELET - Abnormal; Notable for the following components:      Result Value   Eosinophils Absolute 0.9 (*)    All other components within normal limits  COMPREHENSIVE METABOLIC PANEL - Abnormal; Notable for the following components:   Glucose, Bld 126 (*)    All other components within normal limits    EKG None  Radiology No results found.  Procedures Procedures (including critical care time)  Medications Ordered in ED Medications - No data to display   Initial Impression / Assessment and Plan / ED Course  I have reviewed the triage vital signs and the nursing notes.  Pertinent labs & imaging results that were available during my care of the patient were reviewed by me and considered in my medical decision making (see chart for details).     Patient with swelling of bilateral lower extremities and rash.  He was told to stop his Tegretol 5 days ago because it can cause leg swelling, he has not noticed a difference.  Patient is afebrile, normal white blood cell count, normal vital signs.  Pathology of the rash does not look consistent with bacterial or fungal infection.  Patient does not have any signs of congestive heart failure at this time.  Lower extremity edema appears to be  peripheral in nature and possibly the rash is simply stasis dermatitis.  Cannot rule out some type of allergic reaction, however.  Patient will be treated empirically with Lasix and prednisone, follow-up with his primary care doctor.  Return if his rash worsens.  Final Clinical Impressions(s) / ED Diagnoses   Final diagnoses:  Peripheral edema  Dermatitis    ED Discharge Orders         Ordered    furosemide (LASIX) 20 MG tablet  Daily     11/10/17 2350    potassium chloride (K-DUR) 10 MEQ tablet  Daily     11/10/17 2350    predniSONE (DELTASONE)  20 MG tablet     11/10/17 2350           Gilda Crease, MD 11/10/17 2350

## 2018-09-23 ENCOUNTER — Other Ambulatory Visit: Payer: Self-pay

## 2018-09-23 DIAGNOSIS — Z20822 Contact with and (suspected) exposure to covid-19: Secondary | ICD-10-CM

## 2018-09-27 LAB — NOVEL CORONAVIRUS, NAA: SARS-CoV-2, NAA: NOT DETECTED

## 2018-12-26 IMAGING — CR DG HIP (WITH OR WITHOUT PELVIS) 2-3V*R*
3 series · 3 of 3 positions shown · non-contrast
Comparison: None.

CLINICAL DATA: No injury.  Posterior hip pain for over 1 year.

EXAM:
DG HIP (WITH OR WITHOUT PELVIS) 2-3V RIGHT

[t pelvis ap (1 of 2)]
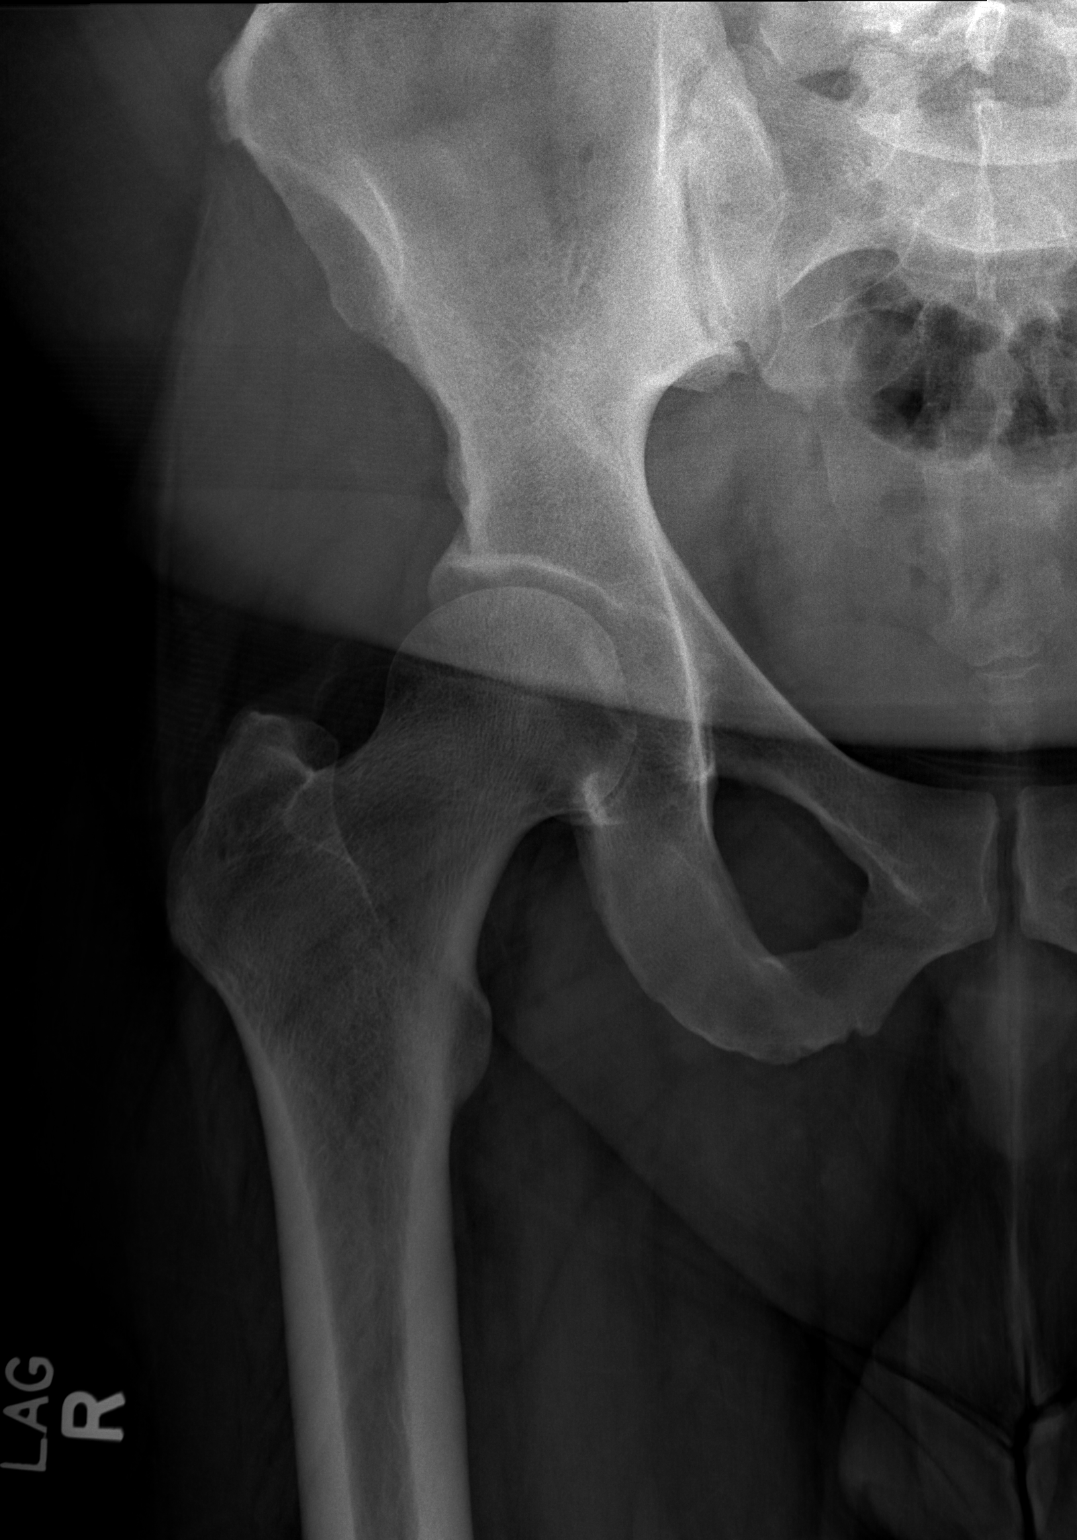

[t pelvis ap (2 of 2)]
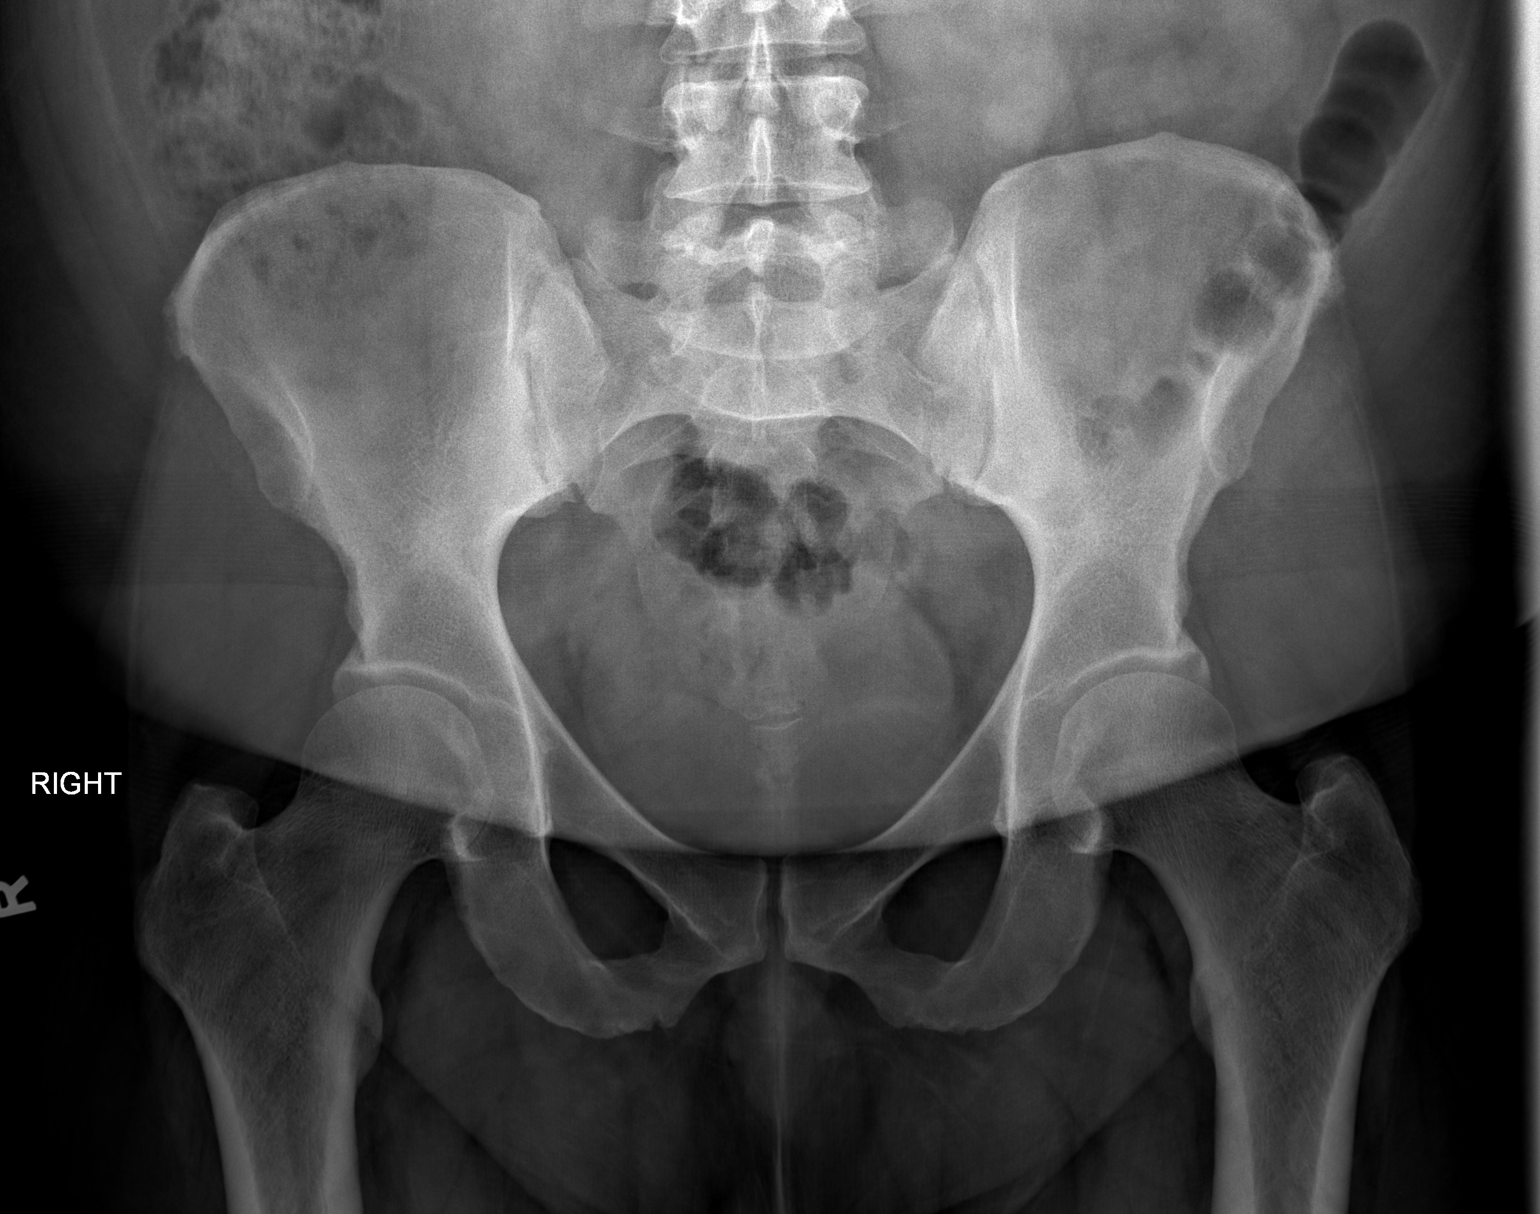

[t hip frog leg right]
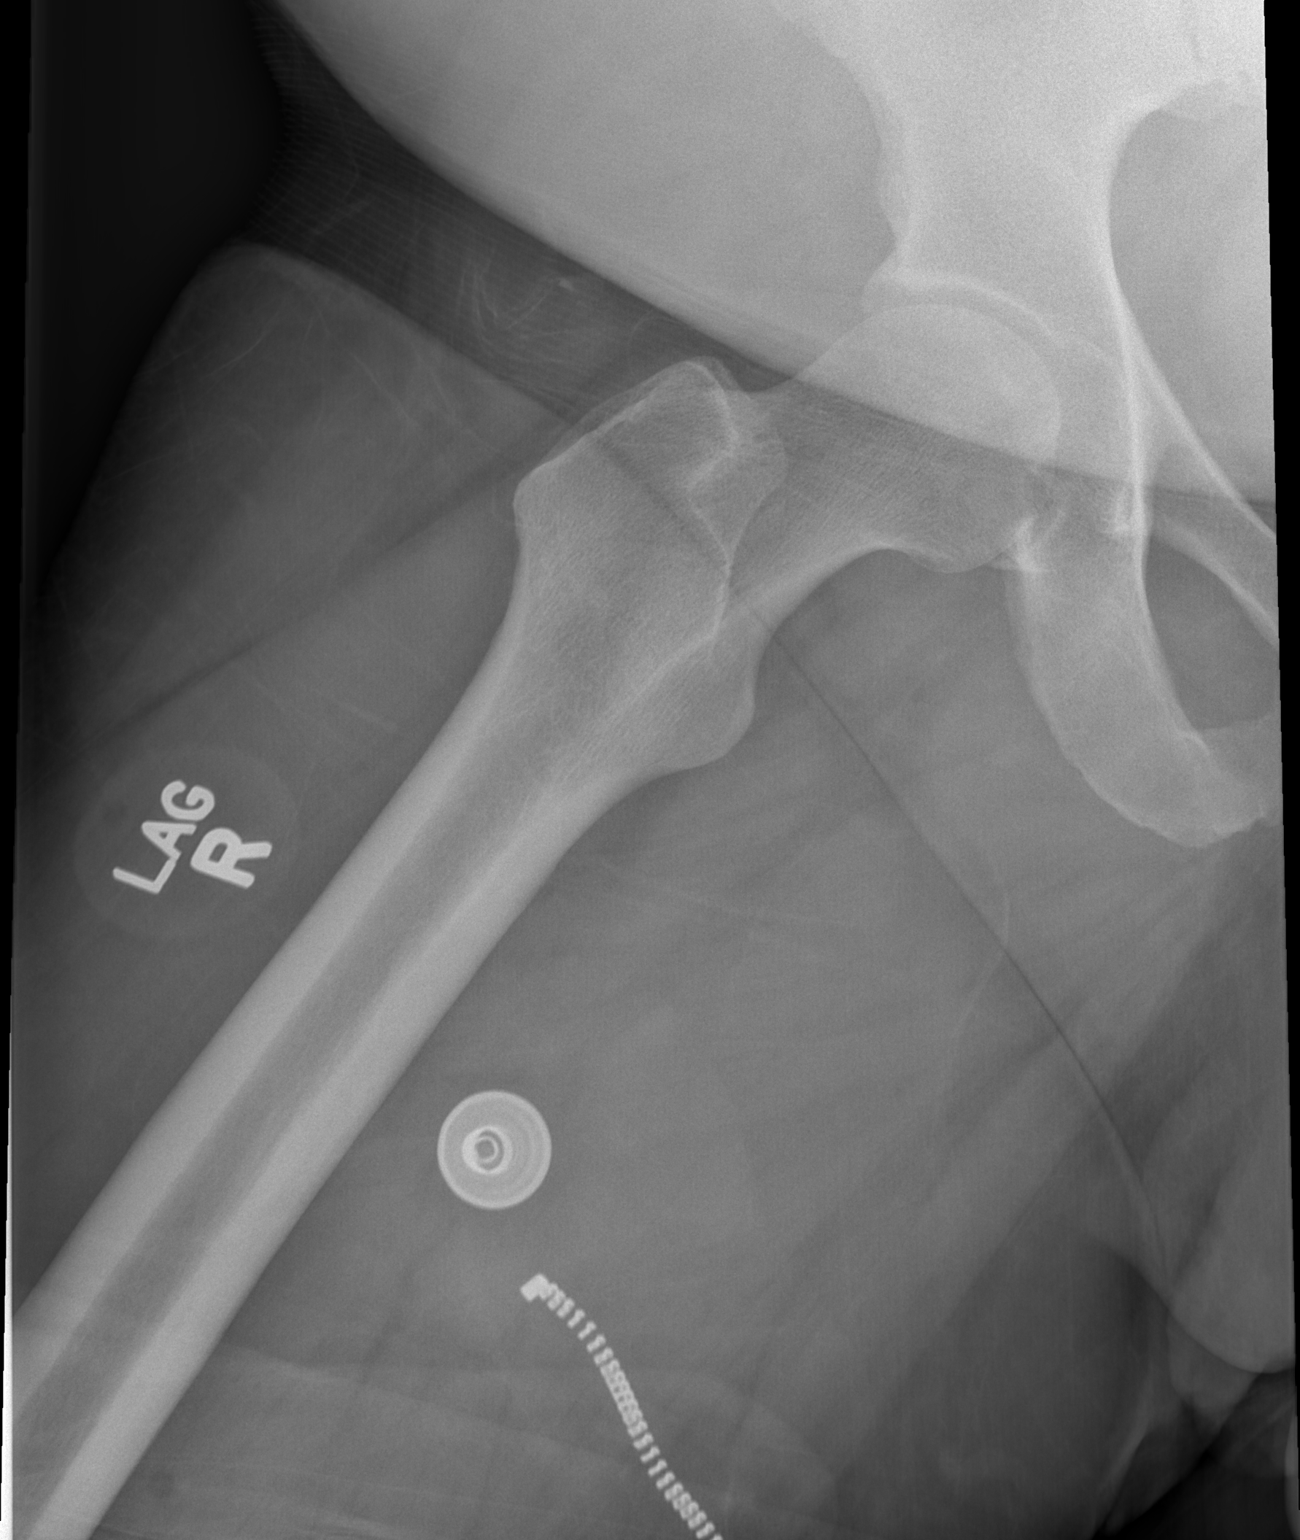

[3 of 3 positions shown; findings below may reference images not displayed]

FINDINGS: There is no evidence of hip fracture or dislocation. There is no
evidence of arthropathy or other focal bone abnormality.
IMPRESSION: Negative.

## 2019-03-08 ENCOUNTER — Encounter (HOSPITAL_COMMUNITY): Payer: Self-pay | Admitting: Emergency Medicine

## 2019-03-08 ENCOUNTER — Emergency Department (HOSPITAL_COMMUNITY)
Admission: EM | Admit: 2019-03-08 | Discharge: 2019-03-08 | Disposition: A | Discharge status: Home / Self Care | Payer: Self-pay | Attending: Emergency Medicine | Admitting: Emergency Medicine

## 2019-03-08 ENCOUNTER — Other Ambulatory Visit: Payer: Self-pay

## 2019-03-08 DIAGNOSIS — J45909 Unspecified asthma, uncomplicated: Secondary | ICD-10-CM | POA: Insufficient documentation

## 2019-03-08 DIAGNOSIS — F17210 Nicotine dependence, cigarettes, uncomplicated: Secondary | ICD-10-CM | POA: Insufficient documentation

## 2019-03-08 DIAGNOSIS — F17220 Nicotine dependence, chewing tobacco, uncomplicated: Secondary | ICD-10-CM | POA: Insufficient documentation

## 2019-03-08 DIAGNOSIS — L0231 Cutaneous abscess of buttock: Secondary | ICD-10-CM | POA: Insufficient documentation

## 2019-03-08 MED ORDER — SULFAMETHOXAZOLE-TRIMETHOPRIM 800-160 MG PO TABS: 1.0000 | ORAL_TABLET | Freq: Two times a day (BID) | ORAL | 0 refills | Status: AC

## 2019-03-08 NOTE — ED Provider Notes (Signed)
[facility]-EMERGENCY DEPT Provider Note   CSN: [id number] Arrival date & time: 03/08/19  2052     History Chief Complaint  Patient presents with  . Abscess    Stephen ELTING is a 38 y.o. male presenting for evaluation of abscess.  Patient states that the past 2 days, he has had worsening pain and swelling of an area on his left buttock.  He does not know when it first began, but it did get worse the past 2 days.  He denies any drainage from the area.  Denies fevers, chills, nausea, vomiting, abdominal pain.  Patient states he has frequent ingrown hairs on his buttock, but normally they go away without intervention.  He states he has no other medical problems, takes no medications daily.  He is not allergic to any antibiotics.  He has not taken anything for pain including Tylenol or ibuprofen.  No pain with bowel movements.  HPI     Past Medical History:  Diagnosis Date  . Anxiety   . Asthma   . Depression   . FH: mental illness   . PTSD (post-traumatic stress disorder)     There are no problems to display for this patient.   Past Surgical History:  Procedure Laterality Date  . TOE SURGERY         Family History  Problem Relation Age of Onset  . Heart failure Mother   . Hypertension Mother   . Liver disease Father     Social History   Tobacco Use  . Smoking status: Current Every Day Smoker    Packs/day: 0.50    Types: Cigarettes  . Smokeless tobacco: Current User    Types: Chew  Substance Use Topics  . Alcohol use: Yes    Comment: Pt reports social drinking  . Drug use: No    Home Medications Prior to Admission medications   Medication Sig Start Date End Date Taking? Authorizing Provider  carbamazepine (TEGRETOL XR) 100 MG 12 hr tablet Take 100 mg by mouth 2 (two) times daily.    [provider]  escitalopram (LEXAPRO) 5 MG tablet Take 5 mg by mouth daily.    [provider]  furosemide (LASIX) 20 MG tablet  Take 1 tablet (20 mg total) by mouth daily. 11/10/17   Gilda Crease, MD  hydrOXYzine (ATARAX/VISTARIL) 25 MG tablet Take 25 mg by mouth 2 (two) times daily as needed for anxiety.    [provider]  methocarbamol (ROBAXIN) 750 MG tablet Take 1 tablet (750 mg total) by mouth every 6 (six) hours as needed for muscle spasms. 10/03/17   Tegeler, Canary Brim, MD  potassium chloride (K-DUR) 10 MEQ tablet Take 1 tablet (10 mEq total) by mouth daily. 11/10/17   Gilda Crease, MD  predniSONE (DELTASONE) 20 MG tablet 3 tabs po daily x 3 days, then 2 tabs x 3 days, then 1.5 tabs x 3 days, then 1 tab x 3 days, then 0.5 tabs x 3 days 11/10/17   Gilda Crease, MD  sulfamethoxazole-trimethoprim (BACTRIM DS) 800-160 MG tablet Take 1 tablet by mouth 2 (two) times daily for 7 days. 03/08/19 03/15/19  Rasheema Truluck, PA-C    Allergies    Patient has no known allergies.  Review of Systems   Review of Systems  Skin:       Abscess of L buttock  Hematological: Does not bruise/bleed easily.    Physical Exam Updated Vital Signs Ht 5\' 8"  (1.727 m)  Wt 113.4 kg   BMI 38.01 kg/m   Physical Exam Vitals and nursing note reviewed. Exam conducted with a chaperone present.  Constitutional:      General: He is not in acute distress.    Appearance: He is well-developed.  HENT:     Head: Normocephalic and atraumatic.  Pulmonary:     Effort: Pulmonary effort is normal.  Abdominal:     General: There is no distension.  Genitourinary:      Comments: Area of induration and fluctuance with central head of the left buttock.  Does not extend towards the rectum. Musculoskeletal:        General: Normal range of motion.     Cervical back: Normal range of motion.  Skin:    General: Skin is warm.     Findings: No rash.  Neurological:     Mental Status: He is alert and oriented to person, place, and time.     ED Results / Procedures / Treatments   Labs (all labs ordered are  listed, but only abnormal results are displayed) Labs Reviewed - No data to display  EKG None  Radiology No results found.  Procedures .Marland Kitchen.Incision and Drainage  Date/Time: 03/08/2019 11:21 PM Performed by: Alveria Apleyaccavale, Yashica Sterbenz, PA-C Authorized by: Alveria Apleyaccavale, Eltha Tingley, PA-C   Consent:    Consent obtained:  Verbal   Consent given by:  Patient   Risks discussed:  Bleeding, damage to other organs, incomplete drainage, pain and infection Location:    Type:  Abscess   Location:  Anogenital Pre-procedure details:    Skin preparation:  Antiseptic wash Anesthesia (see MAR for exact dosages):    Anesthesia method:  None (per pt request) Procedure type:    Complexity:  Simple Procedure details:    Incision types:  Single straight   Incision depth:  Dermal   Scalpel blade:  11   Wound management:  Probed and deloculated and irrigated with saline   Drainage:  Bloody and purulent   Drainage amount:  Scant   Wound treatment:  Wound left open   Packing materials:  None Post-procedure details:    Patient tolerance of procedure:  Tolerated well, no immediate complications   (including critical care time)  Medications Ordered in ED Medications - No data to display  ED Course  I have reviewed the triage vital signs and the nursing notes.  Pertinent labs & imaging results that were available during my care of the patient were reviewed by me and considered in my medical decision making (see chart for details).    MDM Rules/Calculators/A&P                      Patient presenting for evaluation of abscess of the left buttock.  No signs of systemic infection.  Physical examination, he is neurovascularly intact.  I&D performed as described above.  Patient requested that this was done without lidocaine or pain medication.  Minimal purulence expressed.  Discussed aftercare instructions.  Will place patient on Bactrim for further management of the area.  Discussed use of warm compresses.  At  this time, patient appears safe for discharge.  Return precautions given.  Patient states he understands and agrees to plan.  Final Clinical Impression(s) / ED Diagnoses Final diagnoses:  Abscess of left buttock    Rx / DC Orders ED Discharge Orders         Ordered    sulfamethoxazole-trimethoprim (BACTRIM DS) 800-160 MG tablet  2 times daily  03/08/19 2307           Franchot Heidelberg, PA-C 03/08/19 2322    Milton Ferguson, MD 03/09/19 2213

## 2019-03-08 NOTE — ED Triage Notes (Signed)
Patient complaining of an abscess under his left buttocks. Patient states it hurts for him to sit.

## 2019-03-08 NOTE — Discharge Instructions (Addendum)
Take antibiotics as prescribed.  Take entire course, even if your symptoms improve. Take ibuprofen 3 times a day with meals.  Do not take other anti-inflammatories at the same time (Advil, Motrin, naproxen, Aleve). You may supplement with Tylenol if you need further pain control. Use warm compresses 3 times a day to help with pain and swelling. Return to the emergency room if you develop high fevers, severe worsening pain, or any new, worsening, or concerning symptoms.

## 2019-07-08 ENCOUNTER — Emergency Department (HOSPITAL_COMMUNITY)
Admission: EM | Admit: 2019-07-08 | Discharge: 2019-07-08 | Disposition: A | Discharge status: Home / Self Care | Payer: Medicaid Other | Attending: Emergency Medicine | Admitting: Emergency Medicine

## 2019-07-08 ENCOUNTER — Other Ambulatory Visit: Payer: Self-pay

## 2019-07-08 ENCOUNTER — Encounter (HOSPITAL_COMMUNITY): Payer: Self-pay

## 2019-07-08 DIAGNOSIS — R519 Headache, unspecified: Secondary | ICD-10-CM | POA: Insufficient documentation

## 2019-07-08 DIAGNOSIS — Z5321 Procedure and treatment not carried out due to patient leaving prior to being seen by health care provider: Secondary | ICD-10-CM | POA: Insufficient documentation

## 2019-07-08 NOTE — ED Triage Notes (Signed)
Patient arrived stating for about a week he has had a headache on the left side of his head that radiates to the back of his head. Reporting no nausea or vomiting. Endorses sensitivity to light.

## 2019-07-08 NOTE — ED Notes (Signed)
I called patient name twice in the lobby for a room and no one responded

## 2019-07-08 NOTE — ED Triage Notes (Signed)
Patient reports left side of head and neck has been hurting for one week. Pain rated 10/10. Patient states he has not contacted pcp because he has no pcp. Took goody powder this morning to alleviate pain.
